# Patient Record
Sex: Male | Born: 1940 | Race: White | Hispanic: No | Marital: Married | Smoking: Former smoker
Health system: Southern US, Community
[De-identification: ages and names within clinical notes are randomized; demographics above are authoritative.]

## PROBLEM LIST (undated history)

## (undated) DIAGNOSIS — E7800 Pure hypercholesterolemia, unspecified: Secondary | ICD-10-CM

## (undated) DIAGNOSIS — M069 Rheumatoid arthritis, unspecified: Secondary | ICD-10-CM

## (undated) DIAGNOSIS — E039 Hypothyroidism, unspecified: Secondary | ICD-10-CM

## (undated) HISTORY — PX: COLONOSCOPY: SHX174

## (undated) HISTORY — DX: Rheumatoid arthritis, unspecified: M06.9

## (undated) HISTORY — DX: Hypothyroidism, unspecified: E03.9

## (undated) HISTORY — DX: Pure hypercholesterolemia, unspecified: E78.00

## (undated) HISTORY — PX: SHOULDER SURGERY: SHX246

---

## 2002-07-25 ENCOUNTER — Emergency Department (HOSPITAL_COMMUNITY): Admission: EM | Admit: 2002-07-25 | Discharge: 2002-07-25 | Payer: Self-pay | Admitting: Emergency Medicine

## 2002-08-01 ENCOUNTER — Emergency Department (HOSPITAL_COMMUNITY): Admission: EM | Admit: 2002-08-01 | Discharge: 2002-08-01 | Payer: Self-pay | Admitting: Emergency Medicine

## 2006-08-03 ENCOUNTER — Encounter: Admission: RE | Admit: 2006-08-03 | Discharge: 2006-08-03 | Payer: Self-pay | Admitting: Rheumatology

## 2007-11-29 IMAGING — CR DG CHEST 2V
2 series · 2 of 2 positions shown · non-contrast
Comparison: none

CLINICAL DATA: Screening for tuberculosis.  Rheumatoid arthritis. 
 DIAGNOSTIC CHEST ? 2 VIEW: 
 No comparison.

[w chest pa]
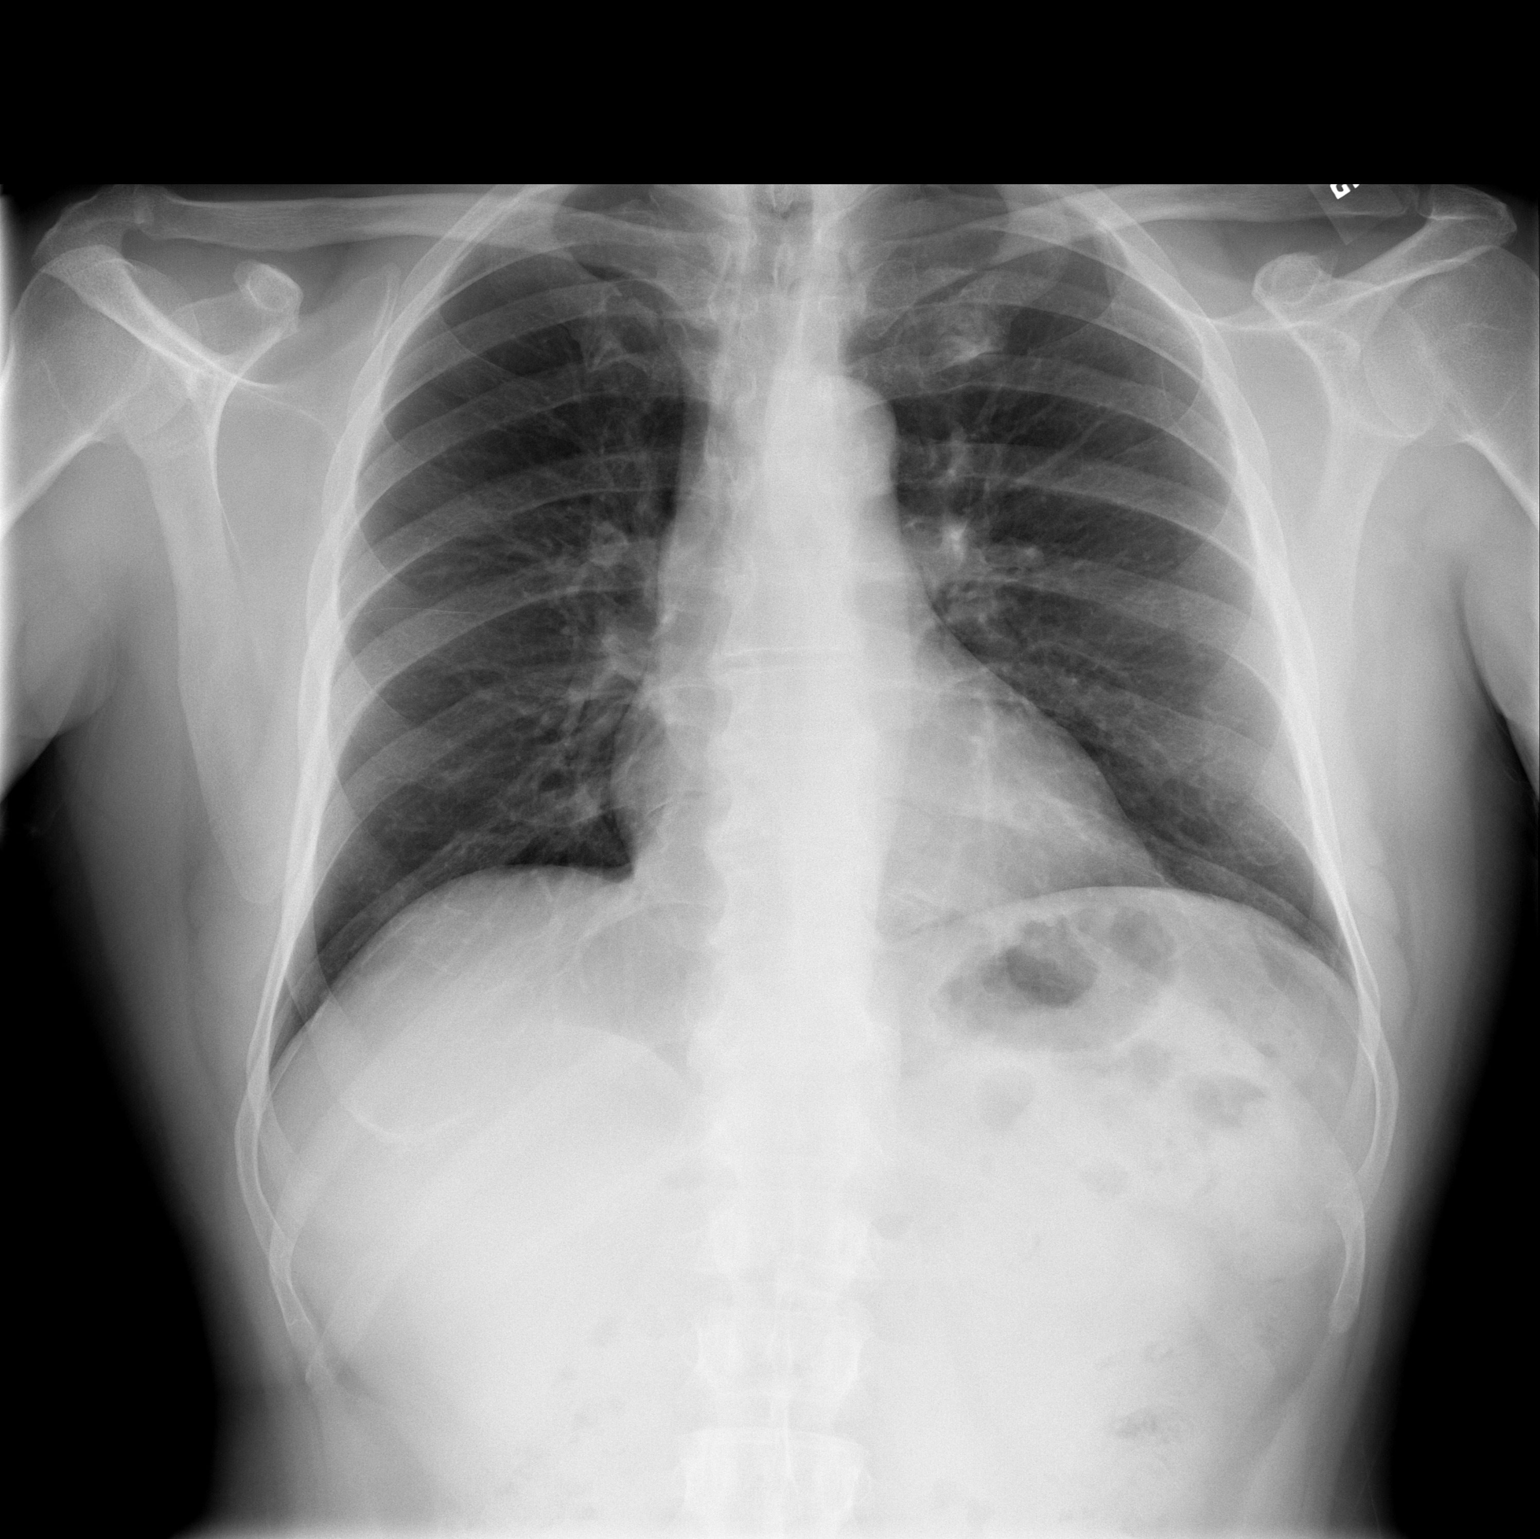

[w chest lat]
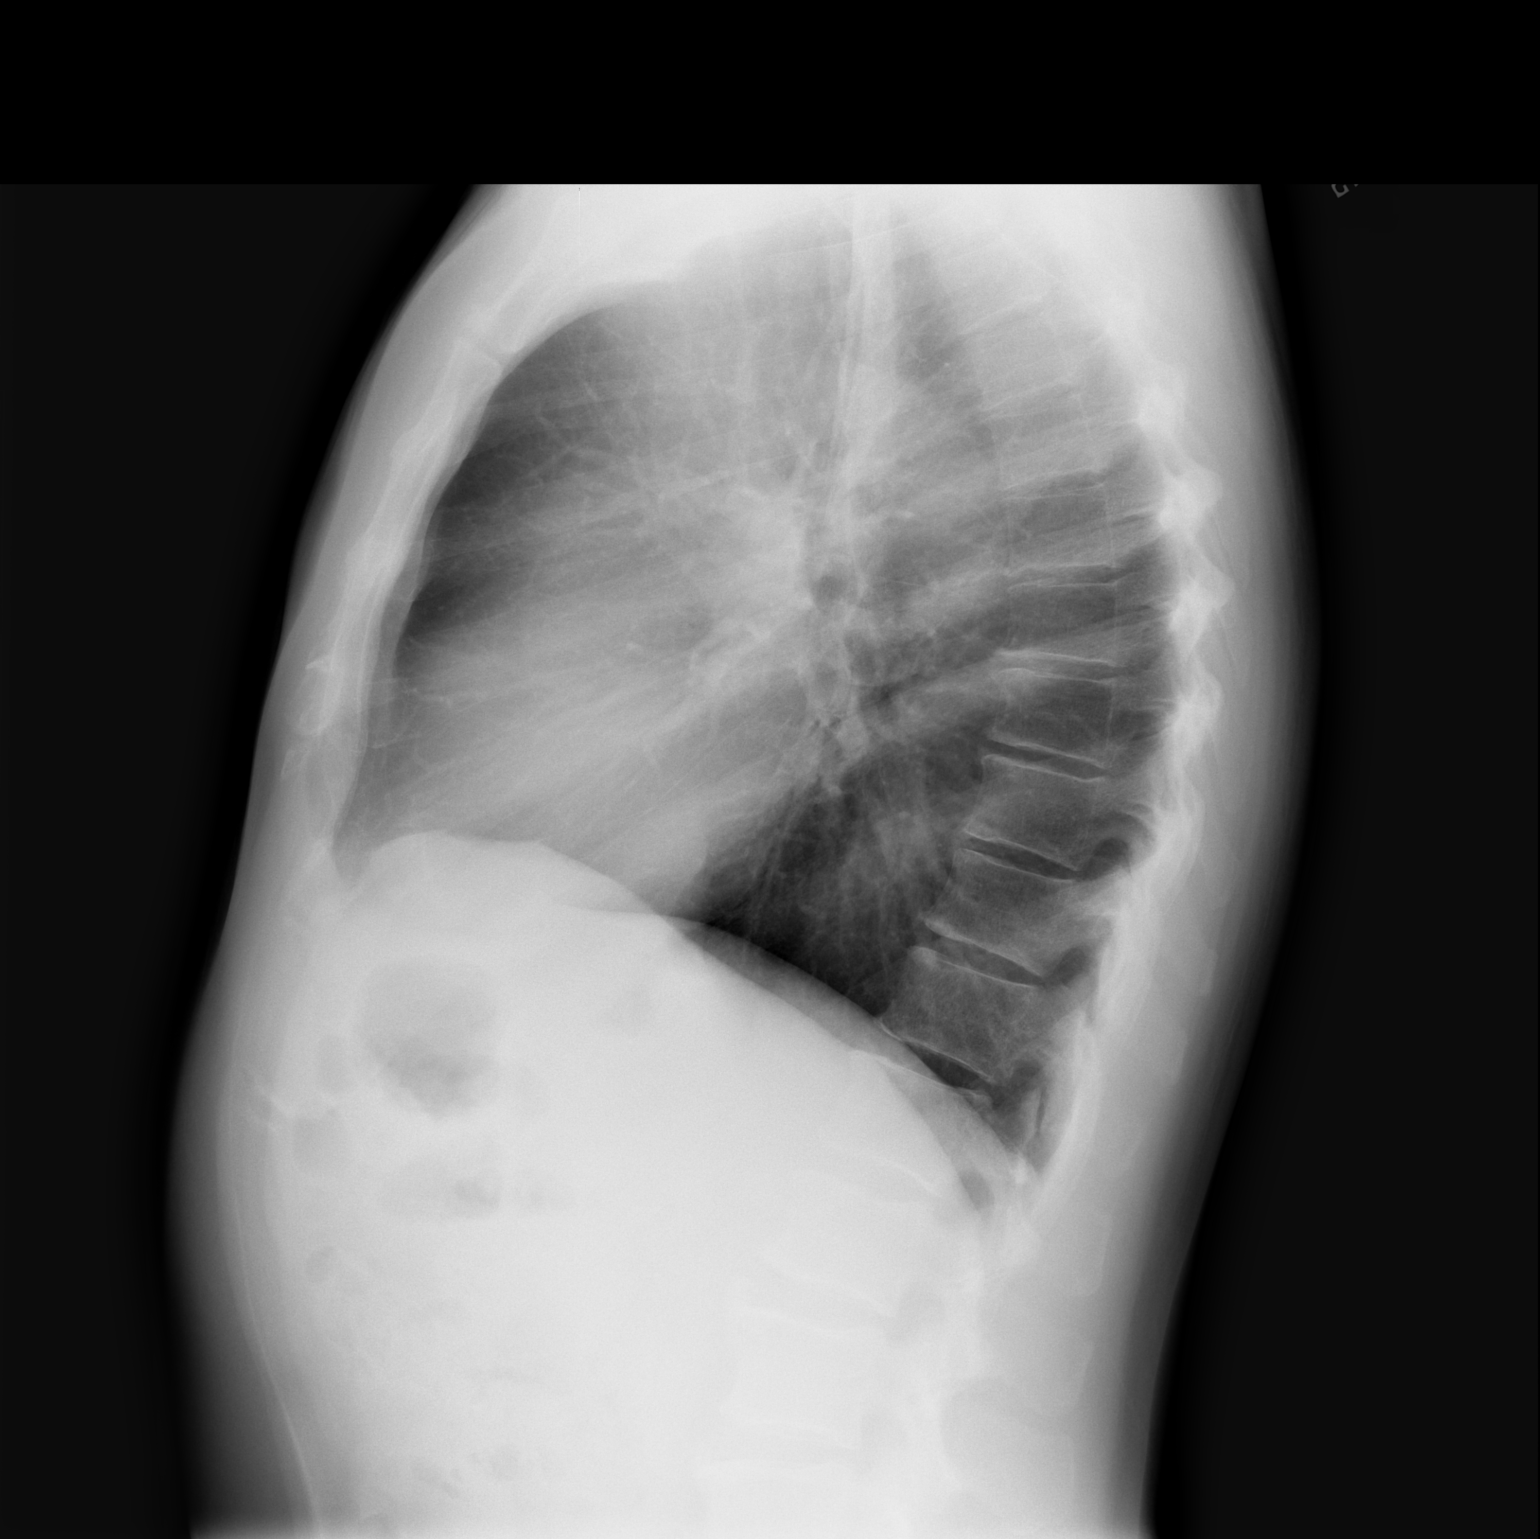

[2 of 2 positions shown; findings below may reference images not displayed]

FINDINGS: The heart size and mediastinal contours are within normal limits.  Both lungs are clear.  The visualized skeletal structures are unremarkable.
IMPRESSION: No active cardiopulmonary disease.

## 2016-08-30 ENCOUNTER — Other Ambulatory Visit: Payer: Self-pay | Admitting: Rheumatology

## 2016-08-30 MED ORDER — FOLIC ACID 1 MG PO TABS: 2.0000 mg | ORAL_TABLET | Freq: Every day | ORAL | 4 refills | Status: DC

## 2016-08-30 NOTE — Telephone Encounter (Signed)
Patient is requesting a refill of folic acid be sent to [facility] [address]

## 2016-08-30 NOTE — Telephone Encounter (Signed)
Last Visit: 06/18/16 Next Visit due February 2018 and message sent to front desk to schedule an appointment.  Okay to refill Folic acid?

## 2016-09-01 ENCOUNTER — Other Ambulatory Visit: Payer: Self-pay | Admitting: *Deleted

## 2016-11-01 ENCOUNTER — Encounter: Payer: Self-pay | Admitting: Rheumatology

## 2016-11-01 ENCOUNTER — Ambulatory Visit (INDEPENDENT_AMBULATORY_CARE_PROVIDER_SITE_OTHER): Payer: Medicare Other | Admitting: Rheumatology

## 2016-11-01 VITALS — BP 130/78 | HR 78 | Resp 16 | Ht 64.5 in | Wt 178.0 lb

## 2016-11-01 DIAGNOSIS — M0609 Rheumatoid arthritis without rheumatoid factor, multiple sites: Secondary | ICD-10-CM | POA: Diagnosis not present

## 2016-11-01 DIAGNOSIS — F40298 Other specified phobia: Secondary | ICD-10-CM | POA: Diagnosis not present

## 2016-11-01 DIAGNOSIS — Z79899 Other long term (current) drug therapy: Secondary | ICD-10-CM

## 2016-11-01 DIAGNOSIS — N289 Disorder of kidney and ureter, unspecified: Secondary | ICD-10-CM

## 2016-11-01 DIAGNOSIS — G8929 Other chronic pain: Secondary | ICD-10-CM

## 2016-11-01 DIAGNOSIS — Z87891 Personal history of nicotine dependence: Secondary | ICD-10-CM | POA: Diagnosis not present

## 2016-11-01 DIAGNOSIS — M25521 Pain in right elbow: Secondary | ICD-10-CM

## 2016-11-01 DIAGNOSIS — M25561 Pain in right knee: Secondary | ICD-10-CM

## 2016-11-01 MED ORDER — FOLIC ACID 1 MG PO TABS: 2.0000 mg | ORAL_TABLET | Freq: Every day | ORAL | 4 refills | Status: DC

## 2016-11-01 MED ORDER — METHOTREXATE 2.5 MG PO TABS: 15.0000 mg | ORAL_TABLET | ORAL | 0 refills | Status: DC

## 2016-11-01 NOTE — Progress Notes (Signed)
Office Visit Note  Patient: Larry Salazar             Date of Birth: 1940-10-18           MRN: [id number]             PCP: Heron Nay, PA Referring: No ref. provider found Visit Date: 11/01/2016 Occupation: @GUAROCC @    Subjective:  Follow-up (wants to change back to Methotrexate tablets) Follow-up on rheumatoid arthritis and high-risk prescription    History of Present Illness: Larry Salazar is a 76 y.o. male  Last seen 03/17/2016. Patient is reporting that he is doing well with the rheumatoid arthritis. No flare whatsoever. He is been off of methotrexate for now 2 weeks. He is on injectable but he does not like needles and although he tried it for stair amount of time, he wants to move back to the pills instead of the injectable methotrexate. He had a hard time affording the pills but now the price is more affordable since he's changed insurance. He states that the pills will be for free. He did respond well to the pills but the reason why he changed was because of affordability issue. He reports that it was over $100 when he was getting it on his previous insurance.  No joint pain swelling and stiffness according to the patient.  He recently had labs done. Please see "care everywhere" for details on the CBC and the CMP. Note that over 2016 until now, his GFR has decreased gradually. During the same. His creatinine has increased (but still is in the normal range.)   Activities of Daily Living:  Patient reports morning stiffness for 5 minutes.   Patient Denies nocturnal pain.  Difficulty dressing/grooming: Denies Difficulty climbing stairs: Denies Difficulty getting out of chair: Denies Difficulty using hands for taps, buttons, cutlery, and/or writing: Denies   Review of Systems  Constitutional: Negative for fatigue.  HENT: Negative for mouth sores and mouth dryness.   Eyes: Negative for dryness.  Respiratory: Negative for shortness of breath.   Gastrointestinal:  Negative for constipation and diarrhea.  Musculoskeletal: Negative for myalgias and myalgias.  Skin: Negative for sensitivity to sunlight.  Neurological: Negative for memory loss.  Psychiatric/Behavioral: Negative for sleep disturbance.    PMFS History:  There are no active problems to display for this patient.   No past medical history on file.  No family history on file. No past surgical history on file. Social History   Social History Narrative  . No narrative on file     Objective: Vital Signs: BP 130/78   Pulse 78   Resp 16   Ht 5' 4.5" (1.638 m)   Wt 178 lb (80.7 kg)   BMI 30.08 kg/m    Physical Exam  Constitutional: He is oriented to person, place, and time. He appears well-developed and well-nourished.  HENT:  Head: Normocephalic and atraumatic.  Eyes: Conjunctivae and EOM are normal. Pupils are equal, round, and reactive to light.  Neck: Normal range of motion. Neck supple.  Cardiovascular: Normal rate, regular rhythm and normal heart sounds.  Exam reveals no gallop and no friction rub.   No murmur heard. Pulmonary/Chest: Effort normal and breath sounds normal. No respiratory distress. He has no wheezes. He has no rales. He exhibits no tenderness.  Abdominal: Soft. He exhibits no distension and no mass. There is no tenderness. There is no guarding.  Musculoskeletal: Normal range of motion.  Lymphadenopathy:    He has no cervical  adenopathy.  Neurological: He is alert and oriented to person, place, and time. He exhibits normal muscle tone. Coordination normal.  Skin: Skin is warm and dry. Capillary refill takes less than 2 seconds. No rash noted.  Psychiatric: He has a normal mood and affect. His behavior is normal. Judgment and thought content normal.  Nursing note and vitals reviewed.    Musculoskeletal Exam:  Full range of motion of all joints Grip strength is equal and strong bilaterally Fibromyalgia tender points are all absent  CDAI Exam: CDAI  Homunculus Exam:   Joint Counts:  CDAI Tender Joint count: 0 CDAI Swollen Joint count: 0  Global Assessments:  Patient Global Assessment: 0 Provider Global Assessment: 0  No synovitis on examination  Investigation: No additional findings. No visits with results within 6 Month(s) from this visit.  Latest known visit with results is:  No results found for any previous visit.   Please see care everywhere for full details on labs. Labs done on 10/22/2016 shows CMP normal except for creatinine mildly elevated at 1.26 and GFR slightly low at 56 and ALT slightly increased at 47. We will monitor. CBC with differential is normal  Imaging: No results found.  Speciality Comments: No specialty comments available.    Procedures:  No procedures performed Allergies: Patient has no allergy information on record.   Assessment / Plan:     Visit Diagnoses: Rheumatoid arthritis, seronegative, multiple sites (HCC)  High risk medications (not anticoagulants) long-term use - 11/01/2016 switch from methotrexate injectable methotrexate oral: 6 pills per week (new insurance and "free cost")  Discontinued smoking - 11/01/2016: ==> d/c'd dec 2016; stopped x 1 year now;  Needle phobia - d/c'd injectable MTX now that oral mtx is affordable.  Pain in right elbow - 11/01/2016: Resolved  Chronic pain of right knee - 11/01/2016: Resolved    Plan: #1: Patient is doing very well with injectable methotrexate but due to the fear of needles and due to his new cost of methotrexate pills being "free" with his new insurance patient wants to go back on pills. We will call in methotrexate 6 pills per week 72 pills with [facility]. Note that he's been off of methotrexate injectable now for about 2 weeks. Also refilled his folic acid 90 day supply with 4 refills patient's joints are doing very well at this time. No synovitis, no joint pain or swelling or stiffness. Usually has a history of  right elbow joint pain of right knee joint pain but no longer.  #3: Patient has stopped smoking. It's been about a year and a month now that he has stopped smoking. This is the fourth time he has stopped smoking. He states that he does not plan to restart at this time  #4: CBC with differential and CMP with GFR will be due in about 3 months.  #5: Mild elevation of creatinine and mild decrease in GFR. We discussed the importance of having adequate hydration, avoiding medicines that can aggravate the kidney function.  #6: Patient has BMI of 30. We encouraged the patient to lose weight. She will observe proper exercise and nutrition.  #7: Return to clinic in 5 months  Orders: No orders of the defined types were placed in this encounter.  Meds ordered this encounter  Medications  . methotrexate (RHEUMATREX) 2.5 MG tablet    Sig: Take 6 tablets (15 mg total) by mouth once a week. Caution:Chemotherapy. Protect from light.    Dispense:  72 tablet  Refill:  0  . folic acid (FOLVITE) 1 MG tablet    Sig: Take 2 tablets (2 mg total) by mouth daily.    Dispense:  180 tablet    Refill:  4    Order Specific Question:   Supervising Provider    Answer:   Pollyann SavoyEVESHWAR, SHAILI [phone][[id number]]    Face-to-face time spent with patient was 30 minutes. 50% of time was spent in counseling and coordination of care.  Follow-Up Instructions: Return in about 5 months (around 03/31/2017).   Tawni PummelNaitik Jerrika Ledlow, PA-C  Note - This record has been created using [employer] software.  Chart creation errors have been sought, but may not always  have been located. Such creation errors do not reflect on  the standard of medical care.

## 2016-11-03 ENCOUNTER — Other Ambulatory Visit: Payer: Self-pay | Admitting: *Deleted

## 2017-01-21 ENCOUNTER — Telehealth: Payer: Self-pay | Admitting: Rheumatology

## 2017-01-21 DIAGNOSIS — Z79899 Other long term (current) drug therapy: Secondary | ICD-10-CM

## 2017-01-21 DIAGNOSIS — Z79631 Long term (current) use of antimetabolite agent: Secondary | ICD-10-CM

## 2017-01-21 NOTE — Telephone Encounter (Signed)
I do not have labs on him since September, I have Called pt for his lab plan he states he had labs, but they are not in system. I have double checked with [facility], and they were last done Sept. He will come by on Monday. I have put in the orders.

## 2017-01-21 NOTE — Telephone Encounter (Signed)
Patient called requesting a new Rx for MTX  90 day supply, sent to [facility]. Phone #[phone] Patient has a week supply left.

## 2017-01-24 ENCOUNTER — Other Ambulatory Visit: Payer: Self-pay | Admitting: Rheumatology

## 2017-01-24 ENCOUNTER — Other Ambulatory Visit: Payer: Self-pay | Admitting: Radiology

## 2017-01-24 DIAGNOSIS — Z79631 Long term (current) use of antimetabolite agent: Secondary | ICD-10-CM

## 2017-01-24 DIAGNOSIS — Z79899 Other long term (current) drug therapy: Secondary | ICD-10-CM

## 2017-01-24 LAB — CBC WITH DIFFERENTIAL/PLATELET
Basophils Absolute: 54 cells/uL (ref 0–200)
Basophils Relative: 1 %
Eosinophils Absolute: 216 cells/uL (ref 15–500)
Eosinophils Relative: 4 %
HCT: 45.2 % (ref 38.5–50.0)
Hemoglobin: 14.6 g/dL (ref 13.2–17.1)
Lymphocytes Relative: 22 %
Lymphs Abs: 1188 cells/uL (ref 850–3900)
MCH: 31.9 pg (ref 27.0–33.0)
MCHC: 32.3 g/dL (ref 32.0–36.0)
MCV: 98.9 fL (ref 80.0–100.0)
MPV: 10.2 fL (ref 7.5–12.5)
Monocytes Absolute: 918 cells/uL (ref 200–950)
Monocytes Relative: 17 %
Neutro Abs: 3024 cells/uL (ref 1500–7800)
Neutrophils Relative %: 56 %
Platelets: 181 10*3/uL (ref 140–400)
RBC: 4.57 MIL/uL (ref 4.20–5.80)
RDW: 13.9 % (ref 11.0–15.0)
WBC: 5.4 10*3/uL (ref 3.8–10.8)

## 2017-01-24 LAB — COMPREHENSIVE METABOLIC PANEL
AG RATIO: 1.3 ratio (ref 1.0–2.5)
ALK PHOS: 68 U/L (ref 40–115)
ALT: 22 U/L (ref 9–46)
AST: 37 U/L — AB (ref 10–35)
Albumin: 3.9 g/dL (ref 3.6–5.1)
BUN / CREAT RATIO: 10.5 ratio (ref 6–22)
BUN: 15 mg/dL (ref 7–25)
CHLORIDE: 107 mmol/L (ref 98–110)
CO2: 21 mmol/L (ref 20–31)
CREATININE: 1.43 mg/dL — AB (ref 0.70–1.18)
Calcium: 9.4 mg/dL (ref 8.6–10.3)
GFR, EST AFRICAN AMERICAN: 55 mL/min — AB (ref >=60)
GFR, Est Non African American: 48 mL/min — ABNORMAL LOW (ref >=60)
Globulin: 3 g/dL (ref 1.9–3.7)
Glucose, Bld: 125 mg/dL — ABNORMAL HIGH (ref 65–99)
POTASSIUM: 5 mmol/L (ref 3.5–5.3)
SODIUM: 140 mmol/L (ref 135–146)
Total Bilirubin: 1.3 mg/dL — ABNORMAL HIGH (ref 0.2–1.2)
Total Protein: 6.9 g/dL (ref 6.1–8.1)

## 2017-01-24 MED ORDER — METHOTREXATE 2.5 MG PO TABS: 15.0000 mg | ORAL_TABLET | ORAL | 0 refills | Status: DC

## 2017-01-24 NOTE — Telephone Encounter (Signed)
Patient came in for labs while he was getting labs he had Panwala send in the MTX

## 2017-01-24 NOTE — Telephone Encounter (Signed)
Patient is in the office for labs this morning and is requesting refill of MTX to be sent to [facility] (90 day supply) please.

## 2017-01-24 NOTE — Telephone Encounter (Signed)
Mr Larry Salazar has told me he has done this today.

## 2017-01-25 ENCOUNTER — Telehealth: Payer: Self-pay | Admitting: Radiology

## 2017-01-25 NOTE — Telephone Encounter (Signed)
-----   Message from Pollyann Savoy, MD sent at 01/25/2017  1:35 PM EDT ----- Please reduce MTX to 5 tabs po qweek

## 2017-01-25 NOTE — Telephone Encounter (Signed)
Called / line busy  Updated Rx module to reflect new dose.

## 2017-01-25 NOTE — Progress Notes (Signed)
Please reduce MTX to 5 tabs po qweek

## 2017-01-26 NOTE — Telephone Encounter (Signed)
Patient has been advised to decrease MTX to 5 tablet per week.

## 2017-03-31 ENCOUNTER — Ambulatory Visit: Payer: Medicare Other | Admitting: Rheumatology

## 2017-04-28 ENCOUNTER — Telehealth: Payer: Self-pay | Admitting: Rheumatology

## 2017-04-28 NOTE — Telephone Encounter (Signed)
11/01/16 last visit Next visit 05/12/17  CBC Latest Ref Rng & Units 01/24/2017  WBC 3.8 - 10.8 K/uL 5.4  Hemoglobin 13.2 - 17.1 g/dL 16.114.6  Hematocrit 09.638.5 - 50.0 % 45.2  Platelets 140 - 400 K/uL 181    CMP Latest Ref Rng & Units 01/24/2017  Glucose 65 - 99 mg/dL 045(W125(H)  BUN 7 - 25 mg/dL 15  Creatinine 0.980.70 - 1.191.18 mg/dL 1.47(W1.43(H)  Sodium 295135 - 621146 mmol/L 140  Potassium 3.5 - 5.3 mmol/L 5.0  Chloride 98 - 110 mmol/L 107  CO2 20 - 31 mmol/L 21  Calcium 8.6 - 10.3 mg/dL 9.4  Total Protein 6.1 - 8.1 g/dL 6.9  Total Bilirubin 0.2 - 1.2 mg/dL 3.0(Q1.3(H)  Alkaline Phos 40 - 115 U/L 68  AST 10 - 35 U/L 37(H)  ALT 9 - 46 U/L 22   Labs due for MTX / he is taking 5 per week, was decreased after last set of labs.   Patient states he will come in tomorrow

## 2017-04-28 NOTE — Telephone Encounter (Signed)
Patient left a message on machine that he is out of MTX, and needs a rx to be sent into [facility]. [phone]

## 2017-04-29 ENCOUNTER — Other Ambulatory Visit: Payer: Self-pay | Admitting: Pharmacist

## 2017-04-29 ENCOUNTER — Other Ambulatory Visit: Payer: Self-pay

## 2017-04-29 DIAGNOSIS — Z79631 Long term (current) use of antimetabolite agent: Secondary | ICD-10-CM

## 2017-04-29 DIAGNOSIS — Z79899 Other long term (current) drug therapy: Secondary | ICD-10-CM

## 2017-04-29 LAB — CBC WITH DIFFERENTIAL/PLATELET
BASOS PCT: 1 %
Basophils Absolute: 64 cells/uL (ref 0–200)
EOS ABS: 192 {cells}/uL (ref 15–500)
Eosinophils Relative: 3 %
HEMATOCRIT: 45.5 % (ref 38.5–50.0)
HEMOGLOBIN: 15.1 g/dL (ref 13.2–17.1)
LYMPHS PCT: 23 %
Lymphs Abs: 1472 cells/uL (ref 850–3900)
MCH: 33.4 pg — ABNORMAL HIGH (ref 27.0–33.0)
MCHC: 33.2 g/dL (ref 32.0–36.0)
MCV: 100.7 fL — ABNORMAL HIGH (ref 80.0–100.0)
MPV: 10.2 fL (ref 7.5–12.5)
Monocytes Absolute: 960 cells/uL — ABNORMAL HIGH (ref 200–950)
Monocytes Relative: 15 %
Neutro Abs: 3712 cells/uL (ref 1500–7800)
Neutrophils Relative %: 58 %
Platelets: 211 10*3/uL (ref 140–400)
RBC: 4.52 MIL/uL (ref 4.20–5.80)
RDW: 13.7 % (ref 11.0–15.0)
WBC: 6.4 10*3/uL (ref 3.8–10.8)

## 2017-04-29 NOTE — Telephone Encounter (Signed)
Patient had standing labs drawn today.  He is requesting refill of methotrexate.  Reviewed his chart.  Dose was reduced to 5 tablets weekly on 01/25/17 due to increase in creatinine to 1.43, GFR 48.  Patient reports he has still been taking 6 tablets weekly.    Last visit: 11/01/16 Next visit: 05/12/17 Labs: drawn today  Advised patient we will wait for labs to come back then assess methotrexate refill based on his lab work.    Lilla Shookachel Sulayman Manning, Pharm.D., BCPS, CPP Clinical Pharmacist Pager: [phone] Phone: [phone] 04/29/2017 9:31 AM

## 2017-04-30 LAB — COMPLETE METABOLIC PANEL WITH GFR
ALT: 22 U/L (ref 9–46)
AST: 32 U/L (ref 10–35)
Albumin: 4.1 g/dL (ref 3.6–5.1)
Alkaline Phosphatase: 72 U/L (ref 40–115)
BUN: 12 mg/dL (ref 7–25)
CALCIUM: 9.5 mg/dL (ref 8.6–10.3)
CHLORIDE: 104 mmol/L (ref 98–110)
CO2: 24 mmol/L (ref 20–31)
CREATININE: 1.19 mg/dL — AB (ref 0.70–1.18)
GFR, Est African American: 69 mL/min (ref >=60)
GFR, Est Non African American: 59 mL/min — ABNORMAL LOW (ref >=60)
Glucose, Bld: 107 mg/dL — ABNORMAL HIGH (ref 65–99)
Potassium: 4.4 mmol/L (ref 3.5–5.3)
Sodium: 141 mmol/L (ref 135–146)
TOTAL PROTEIN: 7 g/dL (ref 6.1–8.1)
Total Bilirubin: 1.1 mg/dL (ref 0.2–1.2)

## 2017-05-01 NOTE — Progress Notes (Signed)
Stable

## 2017-05-02 MED ORDER — METHOTREXATE 2.5 MG PO TABS: 15.0000 mg | ORAL_TABLET | ORAL | 0 refills | Status: DC

## 2017-05-02 NOTE — Telephone Encounter (Signed)
Patient advised he can take 5 tablets of MTX. Patient states he is not currently having any swelling or discomfort. Patient states he would think about going down on his MTX. Prescription refill sent to the pharmacy.

## 2017-05-05 DIAGNOSIS — M19041 Primary osteoarthritis, right hand: Secondary | ICD-10-CM | POA: Insufficient documentation

## 2017-05-05 DIAGNOSIS — M19071 Primary osteoarthritis, right ankle and foot: Secondary | ICD-10-CM | POA: Insufficient documentation

## 2017-05-05 DIAGNOSIS — M0609 Rheumatoid arthritis without rheumatoid factor, multiple sites: Secondary | ICD-10-CM | POA: Insufficient documentation

## 2017-05-05 DIAGNOSIS — Z79899 Other long term (current) drug therapy: Secondary | ICD-10-CM | POA: Insufficient documentation

## 2017-05-05 DIAGNOSIS — Z87891 Personal history of nicotine dependence: Secondary | ICD-10-CM | POA: Insufficient documentation

## 2017-05-05 DIAGNOSIS — Z87448 Personal history of other diseases of urinary system: Secondary | ICD-10-CM | POA: Insufficient documentation

## 2017-05-05 DIAGNOSIS — Z8639 Personal history of other endocrine, nutritional and metabolic disease: Secondary | ICD-10-CM | POA: Insufficient documentation

## 2017-05-05 DIAGNOSIS — Z9889 Other specified postprocedural states: Secondary | ICD-10-CM | POA: Insufficient documentation

## 2017-05-05 DIAGNOSIS — M19042 Primary osteoarthritis, left hand: Secondary | ICD-10-CM

## 2017-05-05 DIAGNOSIS — F40298 Other specified phobia: Secondary | ICD-10-CM | POA: Insufficient documentation

## 2017-05-05 DIAGNOSIS — M19072 Primary osteoarthritis, left ankle and foot: Secondary | ICD-10-CM | POA: Insufficient documentation

## 2017-05-05 NOTE — Progress Notes (Signed)
Office Visit Note  Patient: Larry Salazar             Date of Birth: [birth year]           MRN: [id number]             PCP: Heron NayYoung, Lauren E, PA Referring: Heron NayYoung, Lauren E, PA Visit Date: 05/12/2017 Occupation: @GUAROCC @    Subjective:  Medication Management (has ran out of Methotrexate/ wife has Breast Cancer )   History of Present Illness: Larry Salazar is a 76 y.o. male with history of seronegative rheumatoid arthritis. He states he ran out of methotrexate about 6-8 weeks ago. [facility] prescription [facility] and it was too expensive. [facility] now and he is waiting for the prescription to come back. He states he is not having any swelling while he has been off methotrexate. He has some joint stiffness. He also has some lower back pain which she relates to doing some gardening. His wife has been diagnosed with breast cancer and after that she's been in an accident which is been very stressful for him.  Activities of Daily Living:  Patient reports morning stiffness for 0 minute.   Patient Denies nocturnal pain.  Difficulty dressing/grooming: Denies Difficulty climbing stairs: Denies Difficulty getting out of chair: Denies Difficulty using hands for taps, buttons, cutlery, and/or writing: Denies   Review of Systems  Constitutional: Negative for fatigue, night sweats and weakness ( ).  HENT: Negative for mouth sores, mouth dryness and nose dryness.   Eyes: Negative for redness and dryness.  Respiratory: Negative for shortness of breath and difficulty breathing.   Cardiovascular: Negative for chest pain, palpitations, hypertension, irregular heartbeat and swelling in legs/feet.  Gastrointestinal: Negative for constipation and diarrhea.  Endocrine: Negative for increased urination.  Musculoskeletal: Negative for arthralgias, joint pain, joint swelling, myalgias, muscle weakness, morning stiffness, muscle tenderness and myalgias.  Skin: Negative for color  change, rash, hair loss, nodules/bumps, skin tightness, ulcers and sensitivity to sunlight.  Allergic/Immunologic: Negative for susceptible to infections.  Neurological: Negative for dizziness, fainting, memory loss and night sweats.  Hematological: Negative for swollen glands.  Psychiatric/Behavioral: Negative for depressed mood and sleep disturbance. The patient is not nervous/anxious.     PMFS History:  Patient Active Problem List   Diagnosis Date Noted  . Rheumatoid arthritis, seronegative, multiple sites (HCC) 05/05/2017  . High risk medications  long-term use 05/05/2017  . Needle phobia 05/05/2017  . History of renal insufficiency 05/05/2017  . Primary osteoarthritis of both hands 05/05/2017  . Primary osteoarthritis of both feet 05/05/2017  . History of biceps tendon repair  05/05/2017  . History of hypothyroidism 05/05/2017  . History of hyperlipidemia 05/05/2017  . Former smoker Quit 2017  05/05/2017    History reviewed. No pertinent past medical history.  No family history on file. History reviewed. No pertinent surgical history. Social History   Social History Narrative  . No narrative on file     Objective: Vital Signs: BP 140/78   Pulse 76   Resp 14   Ht 5\' 5"  (1.651 m)   Wt 172 lb (78 kg)   BMI 28.62 kg/m    Physical Exam  Constitutional: He is oriented to person, place, and time. He appears well-developed and well-nourished.  HENT:  Head: Normocephalic and atraumatic.  Eyes: Pupils are equal, round, and reactive to light. Conjunctivae and EOM are normal.  Neck: Normal range of motion. Neck supple.  Cardiovascular: Normal  rate, regular rhythm and normal heart sounds.   Pulmonary/Chest: Effort normal and breath sounds normal.  Abdominal: Soft. Bowel sounds are normal.  Neurological: He is alert and oriented to person, place, and time.  Skin: Skin is warm and dry. Capillary refill takes less than 2 seconds.  Psychiatric: He has a normal mood and affect.  His behavior is normal.  Nursing note and vitals reviewed.    Musculoskeletal Exam: He has some discomfort with range of motion of his lumbar spine. Shoulder joints elbow joints wrist joint MCPs PIPs DIPs with good range of motion. He had no synovitis. He has thickening of PIP/DIP joints consistent with osteoarthritis. Hip joints knee joints ankles MTPs PIPs DIPs are good range of motion with no synovitis.  CDAI Exam: CDAI Homunculus Exam:   Joint Counts:  CDAI Tender Joint count: 0 CDAI Swollen Joint count: 0  Global Assessments:  Patient Global Assessment: 1 Provider Global Assessment: 1  CDAI Calculated Score: 2    Investigation: No additional findings. CBC Latest Ref Rng & Units 04/29/2017 01/24/2017  WBC 3.8 - 10.8 K/uL 6.4 5.4  Hemoglobin 13.2 - 17.1 g/dL 45.4 09.8  Hematocrit 11.9 - 50.0 % 45.5 45.2  Platelets 140 - 400 K/uL 211 181   CMP Latest Ref Rng & Units 04/29/2017 01/24/2017  Glucose 65 - 99 mg/dL 147(W) 295(A)  BUN 7 - 25 mg/dL 12 15  Creatinine 2.13 - 1.18 mg/dL 0.86(V) 7.84(O)  Sodium 135 - 146 mmol/L 141 140  Potassium 3.5 - 5.3 mmol/L 4.4 5.0  Chloride 98 - 110 mmol/L 104 107  CO2 20 - 31 mmol/L 24 21  Calcium 8.6 - 10.3 mg/dL 9.5 9.4  Total Protein 6.1 - 8.1 g/dL 7.0 6.9  Total Bilirubin 0.2 - 1.2 mg/dL 1.1 9.6(E)  Alkaline Phos 40 - 115 U/L 72 68  AST 10 - 35 U/L 32 37(H)  ALT 9 - 46 U/L 22 22    Imaging: No results found.  Speciality Comments: No specialty comments available.    Procedures:  No procedures performed Allergies: Patient has no allergy information on record.   Assessment / Plan:     Visit Diagnoses: Rheumatoid arthritis, seronegative, multiple sites [facility]): He had no synovitis on examination today. He has had flare off methotrexate in the past. His been experiencing some morning stiffness. He will be [facility] and will resume his medication. We discussed about decreasing methotrexate to 5  tablets per week next month if he continues to do well.  High risk medications long-term use - Methotrexate 6 tablets by mouth every week, folic acid 1 mg by mouth daily. His creatinine is mildly elevated but stable.  Needle phobia  History of renal insufficiency: GFR is stable  Primary osteoarthritis of both hands: Minimal stiffness  Primary osteoarthritis of both feet: Minimal stiffness  History of biceps tendon repair right  History of hypothyroidism  History of hyperlipidemia  Former smoker Quit 2017     Orders: No orders of the defined types were placed in this encounter.  No orders of the defined types were placed in this encounter.     Follow-Up Instructions: Return in about 5 months (around 10/12/2017) for Rheumatoid arthritis.   Pollyann Savoy, MD  Note - This record has been created using Animal nutritionist.  Chart creation errors have been sought, but may not always  have been located. Such creation errors do not reflect on  the standard of medical care.

## 2017-05-09 ENCOUNTER — Telehealth: Payer: Self-pay | Admitting: Rheumatology

## 2017-05-09 MED ORDER — METHOTREXATE 2.5 MG PO TABS: 15.0000 mg | ORAL_TABLET | ORAL | 0 refills | Status: DC

## 2017-05-09 NOTE — Telephone Encounter (Signed)
Patient states [facility] for [facility]TX tabs [facility], but it needed to be [facility] to [facility]. [facility] #[phone]

## 2017-05-09 NOTE — Telephone Encounter (Signed)
Prescription resent to [facility].

## 2017-05-12 ENCOUNTER — Telehealth: Payer: Self-pay | Admitting: Rheumatology

## 2017-05-12 ENCOUNTER — Encounter: Payer: Self-pay | Admitting: Rheumatology

## 2017-05-12 ENCOUNTER — Ambulatory Visit (INDEPENDENT_AMBULATORY_CARE_PROVIDER_SITE_OTHER): Payer: Medicare Other | Admitting: Rheumatology

## 2017-05-12 VITALS — BP 140/78 | HR 76 | Resp 14 | Ht 65.0 in | Wt 172.0 lb

## 2017-05-12 DIAGNOSIS — M19041 Primary osteoarthritis, right hand: Secondary | ICD-10-CM

## 2017-05-12 DIAGNOSIS — M19042 Primary osteoarthritis, left hand: Secondary | ICD-10-CM

## 2017-05-12 DIAGNOSIS — F40298 Other specified phobia: Secondary | ICD-10-CM | POA: Diagnosis not present

## 2017-05-12 DIAGNOSIS — Z8639 Personal history of other endocrine, nutritional and metabolic disease: Secondary | ICD-10-CM | POA: Diagnosis not present

## 2017-05-12 DIAGNOSIS — Z87448 Personal history of other diseases of urinary system: Secondary | ICD-10-CM | POA: Diagnosis not present

## 2017-05-12 DIAGNOSIS — M19072 Primary osteoarthritis, left ankle and foot: Secondary | ICD-10-CM

## 2017-05-12 DIAGNOSIS — Z9889 Other specified postprocedural states: Secondary | ICD-10-CM | POA: Diagnosis not present

## 2017-05-12 DIAGNOSIS — M19071 Primary osteoarthritis, right ankle and foot: Secondary | ICD-10-CM | POA: Diagnosis not present

## 2017-05-12 DIAGNOSIS — Z79899 Other long term (current) drug therapy: Secondary | ICD-10-CM | POA: Diagnosis not present

## 2017-05-12 DIAGNOSIS — Z87891 Personal history of nicotine dependence: Secondary | ICD-10-CM

## 2017-05-12 DIAGNOSIS — M0609 Rheumatoid arthritis without rheumatoid factor, multiple sites: Secondary | ICD-10-CM

## 2017-05-12 NOTE — Patient Instructions (Signed)
Standing Labs We placed an order today for your standing lab work.    Please come back and get your standing labs in November and every 3 months  We have open lab Monday through Friday from 8:30-11:30 AM and 1:30-4 PM at the office of Dr. Azaela Caracci.   The office is located at [address], [address], [place], [place] [address] No appointment is necessary.   Labs are drawn by [facility].  You may receive a bill from [facility] for your lab work. If you have any questions regarding directions or hours of operation,  please call [phone].    

## 2017-05-12 NOTE — Telephone Encounter (Signed)
Please change patient's preferred pharmacy [facility] for all medications.

## 2017-05-19 ENCOUNTER — Other Ambulatory Visit: Payer: Self-pay | Admitting: Rheumatology

## 2017-05-19 NOTE — Telephone Encounter (Signed)
Last Visit: 05/12/17 Next Visit: 11/08/17 Labs: 04/29/17 Stable   Okay to refill per Dr. Corliss Skains

## 2017-08-10 ENCOUNTER — Telehealth: Payer: Self-pay | Admitting: Rheumatology

## 2017-08-10 NOTE — Telephone Encounter (Signed)
Patient needs refill on MTX. Patient uses [facility]. Please call to advise.

## 2017-08-11 MED ORDER — METHOTREXATE 2.5 MG PO TABS: ORAL_TABLET | ORAL | 0 refills | Status: DC

## 2017-08-11 NOTE — Telephone Encounter (Signed)
Last Visit: 05/12/17 Next Visit: 11/08/17 Labs: 04/29/17 Stable   Left message to advise patient he is due for labs  Okay to refill 30 day supply per Dr. Corliss Skainseveshwar

## 2017-08-15 ENCOUNTER — Other Ambulatory Visit: Payer: Self-pay | Admitting: Rheumatology

## 2017-08-16 ENCOUNTER — Other Ambulatory Visit: Payer: Self-pay

## 2017-08-16 DIAGNOSIS — Z79631 Long term (current) use of antimetabolite agent: Secondary | ICD-10-CM

## 2017-08-16 DIAGNOSIS — Z79899 Other long term (current) drug therapy: Secondary | ICD-10-CM

## 2017-08-16 LAB — CBC WITH DIFFERENTIAL/PLATELET
BASOS ABS: 78 {cells}/uL (ref 0–200)
BASOS PCT: 1.3 %
EOS ABS: 102 {cells}/uL (ref 15–500)
Eosinophils Relative: 1.7 %
HCT: 44.6 % (ref 38.5–50.0)
HEMOGLOBIN: 15.2 g/dL (ref 13.2–17.1)
Lymphs Abs: 1176 cells/uL (ref 850–3900)
MCH: 33.1 pg — AB (ref 27.0–33.0)
MCHC: 34.1 g/dL (ref 32.0–36.0)
MCV: 97.2 fL (ref 80.0–100.0)
MPV: 10.2 fL (ref 7.5–12.5)
Monocytes Relative: 14.1 %
NEUTROS ABS: 3798 {cells}/uL (ref 1500–7800)
Neutrophils Relative %: 63.3 %
Platelets: 197 10*3/uL (ref 140–400)
RBC: 4.59 10*6/uL (ref 4.20–5.80)
RDW: 12.6 % (ref 11.0–15.0)
TOTAL LYMPHOCYTE: 19.6 %
WBC: 6 10*3/uL (ref 3.8–10.8)
WBCMIX: 846 {cells}/uL (ref 200–950)

## 2017-08-16 LAB — COMPLETE METABOLIC PANEL WITH GFR
AG RATIO: 1.9 (calc) (ref 1.0–2.5)
ALBUMIN MSPROF: 4.7 g/dL (ref 3.6–5.1)
ALKALINE PHOSPHATASE (APISO): 75 U/L (ref 40–115)
ALT: 28 U/L (ref 9–46)
AST: 46 U/L — AB (ref 10–35)
BUN: 16 mg/dL (ref 7–25)
CO2: 27 mmol/L (ref 20–32)
CREATININE: 1.18 mg/dL (ref 0.70–1.18)
Calcium: 9.2 mg/dL (ref 8.6–10.3)
Chloride: 105 mmol/L (ref 98–110)
GFR, EST AFRICAN AMERICAN: 70 mL/min/{1.73_m2} (ref >=60)
GFR, EST NON AFRICAN AMERICAN: 60 mL/min/{1.73_m2} (ref >=60)
GLOBULIN: 2.5 g/dL (ref 1.9–3.7)
Glucose, Bld: 111 mg/dL — ABNORMAL HIGH (ref 65–99)
Potassium: 4.1 mmol/L (ref 3.5–5.3)
SODIUM: 140 mmol/L (ref 135–146)
Total Bilirubin: 1.9 mg/dL — ABNORMAL HIGH (ref 0.2–1.2)
Total Protein: 7.2 g/dL (ref 6.1–8.1)

## 2017-08-16 NOTE — Telephone Encounter (Addendum)
Prescription was sent in on 08/11/17

## 2017-08-17 ENCOUNTER — Telehealth: Payer: Self-pay | Admitting: Rheumatology

## 2017-08-17 MED ORDER — METHOTREXATE 2.5 MG PO TABS: ORAL_TABLET | ORAL | 0 refills | Status: DC

## 2017-08-17 NOTE — Telephone Encounter (Signed)
Patient calling to see if you will go ahead, and send in the rx for MTX to [facility]. Patient had labs yesterday.

## 2017-08-17 NOTE — Telephone Encounter (Signed)
Patient advised of lab results. Patient has been using Ibuprofen due to a headache. Patient advised to avoid NSAIDS, as well as tylenol and alcohol. Patient verbalized understanding. Patient advised [facility] for a 90 day supply.

## 2017-09-01 ENCOUNTER — Telehealth: Payer: Self-pay

## 2017-09-01 NOTE — Telephone Encounter (Signed)
Patient called stating that he needed a 90 day supply of MTX and would like for someone to call the pharmacy at [phone].  Cb# is [phone].  Please advise.  Thank you.

## 2017-09-01 NOTE — Telephone Encounter (Signed)
Patient advised a 90 day supply has been sent to the pharmacy on 08/17/17. Patient will contact [facility] to find out when prescription will be sent.

## 2017-09-07 ENCOUNTER — Telehealth: Payer: Self-pay

## 2017-09-07 NOTE — Telephone Encounter (Signed)
Patient called concerning [facility] being [facility].

## 2017-09-21 ENCOUNTER — Other Ambulatory Visit: Payer: Self-pay | Admitting: Rheumatology

## 2017-09-21 NOTE — Telephone Encounter (Signed)
Patient left a message requesting an RX refill on his MTX Tab 2.5mg  be called into [facility] at [phone].  He is needing his 90-day supply not the 75-day supply.  His [phone]

## 2017-09-21 NOTE — Telephone Encounter (Signed)
Attempted to contact the patient and left message for patient to call the office.  

## 2017-09-21 NOTE — Telephone Encounter (Signed)
Patient advised prescription was sent for a 90 day supply on 08/17/17. Patient will contact pharmacy and have them contact the office if they are having difficulty filling the prescription.

## 2017-09-23 ENCOUNTER — Telehealth: Payer: Self-pay | Admitting: Rheumatology

## 2017-09-23 MED ORDER — METHOTREXATE 2.5 MG PO TABS: ORAL_TABLET | ORAL | 0 refills | Status: DC

## 2017-09-23 NOTE — Telephone Encounter (Signed)
Patient called stating that his pharmacy [facility] needs our office to call them so they can refill his 90 day supply of Methotrexate.

## 2017-09-23 NOTE — Telephone Encounter (Signed)
Last Visit: 05/12/17 Next Visit: 11/08/17 Labs: 08/16/17 AST trending up. All other lab results are stable.  Okay to refill per Dr. Corliss Skainseveshwar

## 2017-09-26 ENCOUNTER — Telehealth: Payer: Self-pay | Admitting: Rheumatology

## 2017-09-26 NOTE — Telephone Encounter (Signed)
Patient left a voice mail requesting a refill for his Methotrexate.  He stated he left a previous message but wanted to make sure we knew he is completely out.  Thank you

## 2017-09-28 NOTE — Telephone Encounter (Signed)
Patient advised prescription was sent to the pharmacy on 09/23/17. Patient states [facility] will be sending out his prescription.

## 2017-11-08 ENCOUNTER — Ambulatory Visit: Payer: Medicare Other | Admitting: Rheumatology

## 2017-12-14 ENCOUNTER — Other Ambulatory Visit: Payer: Self-pay | Admitting: Rheumatology

## 2017-12-14 NOTE — Telephone Encounter (Signed)
Last Visit: 05/12/17 Next Visit was due January 2019. Left message for patient to advise he is due for a follow up appointment.   Okay to refill 30 day supply per Dr. Corliss Skainseveshwar

## 2018-01-19 ENCOUNTER — Telehealth: Payer: Self-pay | Admitting: Rheumatology

## 2018-01-19 MED ORDER — METHOTREXATE 2.5 MG PO TABS: ORAL_TABLET | ORAL | 0 refills | Status: DC

## 2018-01-19 NOTE — Telephone Encounter (Addendum)
Last Visit: 08/15/17 Next Visit: 01/24/18 Labs: 08/16/17 AST trending up. All other lab results are stable.   patient will update labs at his appointment.  Okay to refill per Dr. Corliss Skainseveshwar.

## 2018-01-19 NOTE — Telephone Encounter (Signed)
Patient needs a refill on MTX sent to [facility]. Patient is out, and over due. Patient wants a 90 supply.

## 2018-01-20 NOTE — Progress Notes (Deleted)
Office Visit Note  Patient: Larry Salazar             Date of Birth: Jun 19, 1941           MRN: [id number]             PCP: Heron NayYoung, Lauren E, PA Referring: Heron NayYoung, Lauren E, PA Visit Date: 01/24/2018 Occupation: @GUAROCC @    Subjective:  No chief complaint on file.   History of Present Illness: Larry Salazar is a 77 y.o. male ***   Activities of Daily Living:  Patient reports morning stiffness for *** {minute/hour:19697}.   Patient {ACTIONS;DENIES/REPORTS:21021675::"Denies"} nocturnal pain.  Difficulty dressing/grooming: {ACTIONS;DENIES/REPORTS:21021675::"Denies"} Difficulty climbing stairs: {ACTIONS;DENIES/REPORTS:21021675::"Denies"} Difficulty getting out of chair: {ACTIONS;DENIES/REPORTS:21021675::"Denies"} Difficulty using hands for taps, buttons, cutlery, and/or writing: {ACTIONS;DENIES/REPORTS:21021675::"Denies"}   No Rheumatology ROS completed.   PMFS History:  Patient Active Problem List   Diagnosis Date Noted  . Rheumatoid arthritis, seronegative, multiple sites (HCC) 05/05/2017  . High risk medications  long-term use 05/05/2017  . Needle phobia 05/05/2017  . History of renal insufficiency 05/05/2017  . Primary osteoarthritis of both hands 05/05/2017  . Primary osteoarthritis of both feet 05/05/2017  . History of biceps tendon repair  05/05/2017  . History of hypothyroidism 05/05/2017  . History of hyperlipidemia 05/05/2017  . Former smoker Quit 2017  05/05/2017    No past medical history on file.  No family history on file. No past surgical history on file. Social History   Social History Narrative  . Not on file     Objective: Vital Signs: There were no vitals taken for this visit.   Physical Exam   Musculoskeletal Exam: ***  CDAI Exam: No CDAI exam completed.    Investigation: No additional findings. CBC Latest Ref Rng & Units 08/16/2017 04/29/2017 01/24/2017  WBC 3.8 - 10.8 Thousand/uL 6.0 6.4 5.4  Hemoglobin 13.2 - 17.1 g/dL 78.215.2 95.615.1  21.314.6  Hematocrit 38.5 - 50.0 % 44.6 45.5 45.2  Platelets 140 - 400 Thousand/uL 197 211 181   CMP Latest Ref Rng & Units 08/16/2017 04/29/2017 01/24/2017  Glucose 65 - 99 mg/dL 086(V111(H) 784(O107(H) 962(X125(H)  BUN 7 - 25 mg/dL 16 12 15   Creatinine 0.70 - 1.18 mg/dL 5.281.18 4.13(K1.19(H) 4.40(N1.43(H)  Sodium 135 - 146 mmol/L 140 141 140  Potassium 3.5 - 5.3 mmol/L 4.1 4.4 5.0  Chloride 98 - 110 mmol/L 105 104 107  CO2 20 - 32 mmol/L 27 24 21   Calcium 8.6 - 10.3 mg/dL 9.2 9.5 9.4  Total Protein 6.1 - 8.1 g/dL 7.2 7.0 6.9  Total Bilirubin 0.2 - 1.2 mg/dL 0.2(V1.9(H) 1.1 2.5(D1.3(H)  Alkaline Phos 40 - 115 U/L - 72 68  AST 10 - 35 U/L 46(H) 32 37(H)  ALT 9 - 46 U/L 28 22 22      Imaging: No results found.  Speciality Comments: No specialty comments available.    Procedures:  No procedures performed Allergies: Patient has no allergy information on record.   Assessment / Plan:     Visit Diagnoses: Rheumatoid arthritis, seronegative, multiple sites (HCC)  High risk medications  long-term use - MTX and folic acid CBC and CMP:  Primary osteoarthritis of both hands  Primary osteoarthritis of both feet  History of biceps tendon repair   History of hyperlipidemia  History of hypothyroidism  History of renal insufficiency  Needle phobia  Former smoker Quit 2017     Orders: No orders of the defined types were placed in this encounter.  No orders of the defined types  were placed in this encounter.   Face-to-face time spent with patient was *** minutes. 50% of time was spent in counseling and coordination of care.  Follow-Up Instructions: No follow-ups on file.   Ofilia Neas, PA-C  Note - This record has been created using Dragon software.  Chart creation errors have been sought, but may not always  have been located. Such creation errors do not reflect on  the standard of medical care.

## 2018-01-23 ENCOUNTER — Ambulatory Visit (INDEPENDENT_AMBULATORY_CARE_PROVIDER_SITE_OTHER): Payer: Medicare Other | Admitting: Rheumatology

## 2018-01-23 ENCOUNTER — Other Ambulatory Visit: Payer: Self-pay | Admitting: *Deleted

## 2018-01-23 ENCOUNTER — Encounter: Payer: Self-pay | Admitting: Physician Assistant

## 2018-01-23 VITALS — BP 145/86 | HR 51 | Resp 12 | Ht 65.0 in | Wt 182.0 lb

## 2018-01-23 DIAGNOSIS — F40298 Other specified phobia: Secondary | ICD-10-CM

## 2018-01-23 DIAGNOSIS — M19041 Primary osteoarthritis, right hand: Secondary | ICD-10-CM | POA: Diagnosis not present

## 2018-01-23 DIAGNOSIS — Z8639 Personal history of other endocrine, nutritional and metabolic disease: Secondary | ICD-10-CM | POA: Diagnosis not present

## 2018-01-23 DIAGNOSIS — M19071 Primary osteoarthritis, right ankle and foot: Secondary | ICD-10-CM

## 2018-01-23 DIAGNOSIS — Z79899 Other long term (current) drug therapy: Secondary | ICD-10-CM | POA: Diagnosis not present

## 2018-01-23 DIAGNOSIS — M19072 Primary osteoarthritis, left ankle and foot: Secondary | ICD-10-CM

## 2018-01-23 DIAGNOSIS — Z87891 Personal history of nicotine dependence: Secondary | ICD-10-CM

## 2018-01-23 DIAGNOSIS — M19042 Primary osteoarthritis, left hand: Secondary | ICD-10-CM | POA: Diagnosis not present

## 2018-01-23 DIAGNOSIS — Z9889 Other specified postprocedural states: Secondary | ICD-10-CM | POA: Diagnosis not present

## 2018-01-23 DIAGNOSIS — Z87448 Personal history of other diseases of urinary system: Secondary | ICD-10-CM

## 2018-01-23 DIAGNOSIS — M0609 Rheumatoid arthritis without rheumatoid factor, multiple sites: Secondary | ICD-10-CM | POA: Diagnosis not present

## 2018-01-23 LAB — CBC WITH DIFFERENTIAL/PLATELET
Basophils Absolute: 80 cells/uL (ref 0–200)
Basophils Relative: 1.2 %
EOS PCT: 2.5 %
Eosinophils Absolute: 168 cells/uL (ref 15–500)
HEMATOCRIT: 42 % (ref 38.5–50.0)
Hemoglobin: 14.4 g/dL (ref 13.2–17.1)
LYMPHS ABS: 1508 {cells}/uL (ref 850–3900)
MCH: 33 pg (ref 27.0–33.0)
MCHC: 34.3 g/dL (ref 32.0–36.0)
MCV: 96.3 fL (ref 80.0–100.0)
MPV: 10.8 fL (ref 7.5–12.5)
Monocytes Relative: 16.3 %
NEUTROS PCT: 57.5 %
Neutro Abs: 3853 cells/uL (ref 1500–7800)
PLATELETS: 192 10*3/uL (ref 140–400)
RBC: 4.36 10*6/uL (ref 4.20–5.80)
RDW: 12.2 % (ref 11.0–15.0)
TOTAL LYMPHOCYTE: 22.5 %
WBC mixed population: 1092 cells/uL — ABNORMAL HIGH (ref 200–950)
WBC: 6.7 10*3/uL (ref 3.8–10.8)

## 2018-01-23 LAB — COMPLETE METABOLIC PANEL WITH GFR
AG RATIO: 1.6 (calc) (ref 1.0–2.5)
ALBUMIN MSPROF: 4.5 g/dL (ref 3.6–5.1)
ALKALINE PHOSPHATASE (APISO): 77 U/L (ref 40–115)
ALT: 15 U/L (ref 9–46)
AST: 29 U/L (ref 10–35)
BILIRUBIN TOTAL: 1.1 mg/dL (ref 0.2–1.2)
BUN / CREAT RATIO: 13 (calc) (ref 6–22)
BUN: 17 mg/dL (ref 7–25)
CHLORIDE: 106 mmol/L (ref 98–110)
CO2: 28 mmol/L (ref 20–32)
Calcium: 9.5 mg/dL (ref 8.6–10.3)
Creat: 1.28 mg/dL — ABNORMAL HIGH (ref 0.70–1.18)
GFR, EST AFRICAN AMERICAN: 63 mL/min/{1.73_m2} (ref >=60)
GFR, Est Non African American: 54 mL/min/{1.73_m2} — ABNORMAL LOW (ref >=60)
GLOBULIN: 2.8 g/dL (ref 1.9–3.7)
GLUCOSE: 114 mg/dL — AB (ref 65–99)
POTASSIUM: 4.8 mmol/L (ref 3.5–5.3)
SODIUM: 139 mmol/L (ref 135–146)
TOTAL PROTEIN: 7.3 g/dL (ref 6.1–8.1)

## 2018-01-23 NOTE — Patient Instructions (Signed)
Standing Labs We placed an order today for your standing lab work.    Please come back and get your standing labs in July and every 3 months   We have open lab Monday through Friday from 8:30-11:30 AM and 1:30-4:00 PM  at the office of Dr. Shaili Deveshwar.   You may experience shorter wait times on Monday and Friday afternoons. The office is located at [address], [address], [place], [place] [address] No appointment is necessary.   Labs are drawn by [facility].  You may receive a bill from [facility] for your lab work. If you have any questions regarding directions or hours of operation,  please call [phone].    

## 2018-01-23 NOTE — Progress Notes (Signed)
Office Visit Note  Patient: Larry Salazar             Date of Birth: 05-19-1941           MRN: [id number]             PCP: Heron Nay, PA Referring: Heron Nay, PA Visit Date: 01/23/2018 Occupation: @    Subjective:  Medication Management   History of Present Illness: Cloyde Oregel is a 77 y.o. male with history of seronegative rheumatoid arthritis and osteoarthritis.  Patient states he has been off his methotrexate and folic acid for 3.5 weeks.  He was taking methotrexate 5 tablets weekly and folic acid 1 mg daily.  He denies any joint pain or joint swelling at this time.  He denies any recent flares of his rheumatoid arthritis.  He states occasionally will have some hip stiffness especially when he is getting out of bed in the morning.   Activities of Daily Living:  Patient reports morning stiffness for 5-10 minutes.   Patient Denies nocturnal pain.  Difficulty dressing/grooming: Denies Difficulty climbing stairs: Denies Difficulty getting out of chair: Reports Difficulty using hands for taps, buttons, cutlery, and/or writing: Denies   Review of Systems  Constitutional: Positive for fatigue. Negative for night sweats.  HENT: Negative for mouth sores, mouth dryness and nose dryness.   Eyes: Negative for redness and dryness.  Respiratory: Negative for cough, hemoptysis, shortness of breath and difficulty breathing.   Cardiovascular: Negative for chest pain, palpitations, hypertension, irregular heartbeat and swelling in legs/feet.  Gastrointestinal: Negative for blood in stool, constipation and diarrhea.  Endocrine: Negative for increased urination.  Genitourinary: Negative for painful urination.  Musculoskeletal: Negative for arthralgias, joint pain, joint swelling, myalgias, muscle weakness, morning stiffness, muscle tenderness and myalgias.  Skin: Negative for color change, rash, hair loss, nodules/bumps, skin tightness, ulcers and sensitivity to  sunlight.  Allergic/Immunologic: Negative for susceptible to infections.  Neurological: Negative for dizziness, fainting, memory loss, night sweats and weakness.  Hematological: Negative for swollen glands.  Psychiatric/Behavioral: Negative for depressed mood and sleep disturbance. The patient is not nervous/anxious.     PMFS History:  Patient Active Problem List   Diagnosis Date Noted  . Rheumatoid arthritis, seronegative, multiple sites (HCC) 05/05/2017  . High risk medications  long-term use 05/05/2017  . Needle phobia 05/05/2017  . History of renal insufficiency 05/05/2017  . Primary osteoarthritis of both hands 05/05/2017  . Primary osteoarthritis of both feet 05/05/2017  . History of biceps tendon repair  05/05/2017  . History of hypothyroidism 05/05/2017  . History of hyperlipidemia 05/05/2017  . Former smoker Quit 2017  05/05/2017    Past Medical History:  Diagnosis Date  . High cholesterol   . Hypothyroidism   . Rheumatoid arthritis (HCC)     History reviewed. No pertinent family history. Past Surgical History:  Procedure Laterality Date  . SHOULDER SURGERY Right    Social History   Social History Narrative  . Not on file     Objective: Vital Signs: BP (!) 145/86 (BP Location: Left Arm, Patient Position: Sitting, Cuff Size: Small)   Pulse (!) 51   Resp 12   Ht  (1.651 m)   Wt 182 lb (82.6 kg)   BMI 30.29 kg/m    Physical Exam  Constitutional: He is oriented to person, place, and time. He appears well-developed and well-nourished.  HENT:  Head: Normocephalic and atraumatic.  Eyes: Pupils are equal, round, and reactive to light. Conjunctivae  and EOM are normal.  Neck: Normal range of motion. Neck supple.  Cardiovascular: Normal rate, regular rhythm and normal heart sounds.  Pulmonary/Chest: Effort normal and breath sounds normal.  Abdominal: Soft. Bowel sounds are normal.  Lymphadenopathy:    He has no cervical adenopathy.  Neurological: He is  alert and oriented to person, place, and time.  Skin: Skin is warm and dry. Capillary refill takes less than 2 seconds.  Psychiatric: He has a normal mood and affect. His behavior is normal.  Nursing note and vitals reviewed.    Musculoskeletal Exam: C-spine, thoracic spine, and lumbar spine good ROM.  No midline spinal tenderness.  Shoulder joints, elbow joints, wrist joints, MCPs, PIPs, and DIPs good ROM with no synovitis.  PIP and DIP synovial thickening.  Hip joints, knee joints, ankle joints, MTPs, PIPs, and DIPs good ROM with no synovitis.  No warmth or effusion of knee joints.    CDAI Exam: No CDAI exam completed.    Investigation: No additional findings.   Imaging: No results found.  Speciality Comments: No specialty comments available.    Procedures:  No procedures performed Allergies: Patient has no known allergies.   Assessment / Plan:     Visit Diagnoses: Rheumatoid arthritis, seronegative, multiple sites [facility]): He has no synovitis on exam.  He has not had any recent rheumatoid arthritis flares.  He has no joint pain or joint swelling at this time.  He has been off of his methotrexate and folic acid for 3-1/2 weeks.  A refill of his methotrexate was sent to the pharmacy on 01/19/2018.  He will continue taking methotrexate 4 tablets daily and folic acid 1 mg daily.  He was advised to notify us if he develops increased joint pain or joint swelling.   High risk medications  long-term use -  Methotrexate 4 tablets by mouth every week, folic acid 1 mg by mouth daily.CBC and CMP were drawn today.   Primary osteoarthritis of both hands: PIP and DIP synovial thickening consistent with osteoarthritis.  Joint protection muscle strengthening were discussed.  Primary osteoarthritis of both feet: He has no discomfort at this time.  He wears proper fitting shoes.   Other medical conditions are listed as follows:  Needle phobia  History of renal insufficiency  History of  hypothyroidism  History of hyperlipidemia  History of biceps tendon repair   Former smoker Quit 2017      Orders: No orders of the defined types were placed in this encounter.  No orders of the defined types were placed in this encounter.   Follow-Up Instructions: Return in about 5 months (around 06/25/2018) for Rheumatoid arthritis, Osteoarthritis.   Gearldine Bienenstock, PA-C   I examined and evaluated the patient with Sherron Ales PA.  Patient had no synovitis on my examination he does have DIP thickening.  He had flared each time we tapered him off methotrexate completely.  He has been taking methotrexate only 5 tablets p.o. weekly.  We decided to lower him to 4 tablets p.o. weekly and observe.  The plan of care was discussed as noted above.  Pollyann Savoy, MD  Note - This record has been created using Animal nutritionist.  Chart creation errors have been sought, but may not always  have been located. Such creation errors do not reflect on  the standard of medical care.

## 2018-01-24 ENCOUNTER — Ambulatory Visit: Payer: Medicare Other | Admitting: Physician Assistant

## 2018-01-24 ENCOUNTER — Other Ambulatory Visit: Payer: Self-pay | Admitting: Rheumatology

## 2018-01-24 NOTE — Telephone Encounter (Signed)
Last Visit: 01/23/18 Next Visit: 06/27/18  Okay to refill per Dr. Corliss Skains

## 2018-01-24 NOTE — Progress Notes (Signed)
Cr is high. Advise to reduce MTX to 4 tabs po q wk. Repeat labs in 2 mths.

## 2018-02-13 ENCOUNTER — Telehealth: Payer: Self-pay | Admitting: Rheumatology

## 2018-02-13 NOTE — Telephone Encounter (Signed)
Left message to advise patient that Dr. Corliss Skains aware that he has increased his medication back to MTX 5 tabs weekly and we will just monitor labs for now. Advised to come have labs in 2 months.

## 2018-02-13 NOTE — Telephone Encounter (Signed)
Patient called stating that he had decreased his Methotrexate and Folic Acid to 1 pill per day.  Patient states that he had increased pain and swelling in his hands.  Patient increased his Methotrexate back to 5 pills and Folic Acid to 2 pills.  We decreased MTX due to Creat being 1.28 and GFR 54 which was previously normal.

## 2018-02-13 NOTE — Telephone Encounter (Signed)
Patient called stating that he had decreased his Methotrexate and Folic Acid to 1 pill per day.  Patient states that he had increased pain and swelling in his hands.  Patient increased his Methotrexate back to 5 pills and Folic Acid to 2 pills.

## 2018-02-13 NOTE — Telephone Encounter (Signed)
We will monitor for now.Repeat labs in 2 months.

## 2018-05-18 ENCOUNTER — Other Ambulatory Visit: Payer: Self-pay | Admitting: Rheumatology

## 2018-05-18 DIAGNOSIS — Z79899 Other long term (current) drug therapy: Secondary | ICD-10-CM

## 2018-05-18 NOTE — Telephone Encounter (Signed)
Last Visit: 01/23/18 Next Visit: 06/27/18 Labs: 01/23/18 Cr is high.   Patient advised he is due for labs. Patient will come 05/18/18 to update.

## 2018-05-19 ENCOUNTER — Other Ambulatory Visit: Payer: Self-pay | Admitting: *Deleted

## 2018-05-19 DIAGNOSIS — Z79899 Other long term (current) drug therapy: Secondary | ICD-10-CM

## 2018-05-19 NOTE — Telephone Encounter (Signed)
Patient came in for labs 05/19/18

## 2018-05-20 LAB — CBC WITH DIFFERENTIAL/PLATELET
BASOS PCT: 1.9 %
Basophils Absolute: 110 cells/uL (ref 0–200)
Eosinophils Absolute: 139 cells/uL (ref 15–500)
Eosinophils Relative: 2.4 %
HCT: 44.8 % (ref 38.5–50.0)
Hemoglobin: 14.9 g/dL (ref 13.2–17.1)
Lymphs Abs: 1119 cells/uL (ref 850–3900)
MCH: 32.5 pg (ref 27.0–33.0)
MCHC: 33.3 g/dL (ref 32.0–36.0)
MCV: 97.8 fL (ref 80.0–100.0)
MONOS PCT: 8.5 %
MPV: 10.8 fL (ref 7.5–12.5)
Neutro Abs: 3938 cells/uL (ref 1500–7800)
Neutrophils Relative %: 67.9 %
Platelets: 206 10*3/uL (ref 140–400)
RBC: 4.58 10*6/uL (ref 4.20–5.80)
RDW: 12.6 % (ref 11.0–15.0)
Total Lymphocyte: 19.3 %
WBC: 5.8 10*3/uL (ref 3.8–10.8)
WBCMIX: 493 {cells}/uL (ref 200–950)

## 2018-05-20 LAB — COMPLETE METABOLIC PANEL WITH GFR
AG Ratio: 1.4 (calc) (ref 1.0–2.5)
ALBUMIN MSPROF: 4.4 g/dL (ref 3.6–5.1)
ALT: 20 U/L (ref 9–46)
AST: 34 U/L (ref 10–35)
Alkaline phosphatase (APISO): 80 U/L (ref 40–115)
BUN/Creatinine Ratio: 12 (calc) (ref 6–22)
BUN: 16 mg/dL (ref 7–25)
CALCIUM: 10 mg/dL (ref 8.6–10.3)
CO2: 28 mmol/L (ref 20–32)
CREATININE: 1.29 mg/dL — AB (ref 0.70–1.18)
Chloride: 103 mmol/L (ref 98–110)
GFR, EST NON AFRICAN AMERICAN: 53 mL/min/{1.73_m2} — AB (ref >=60)
GFR, Est African American: 62 mL/min/{1.73_m2} (ref >=60)
GLOBULIN: 3.1 g/dL (ref 1.9–3.7)
GLUCOSE: 106 mg/dL — AB (ref 65–99)
Potassium: 4.9 mmol/L (ref 3.5–5.3)
SODIUM: 140 mmol/L (ref 135–146)
Total Bilirubin: 1.9 mg/dL — ABNORMAL HIGH (ref 0.2–1.2)
Total Protein: 7.5 g/dL (ref 6.1–8.1)

## 2018-06-13 NOTE — Progress Notes (Signed)
Office Visit Note  Patient: Larry Salazar Dizon             Date of Birth: 12-25-1940           MRN: [id number]             PCP: Heron NayYoung, Lauren E, PA Referring: Heron NayYoung, Lauren E, PA Visit Date: 06/27/2018 Occupation: @GUAROCC @  Subjective:  Medication monitoring   History of Present Illness: Larry Salazar Nappi is a 77 y.o. male with history of seronegative rheumatoid arthritis and osteoarthritis.  Patient is on methotrexate 5 tablets by mouth once weekly and folic acid 1 mg daily. He denies any recent flares.  He denies any joint pain or joint swelling.  He tried taking MTX 4 tablets by mouth once a week but he did not feel like it was effective enough so he has been taking 5 tablets by mouth once a week. He states he has had a few dizzy episodes lately. He has fallen several times. He discontinued taking Synthroid and statin therapy.  He does not see a PCP. He has not had his lipids or thyroid levels checked recently.      Activities of Daily Living:  Patient reports morning stiffness for 0  minutes.   Patient Denies nocturnal pain.  Difficulty dressing/grooming: Denies Difficulty climbing stairs: Denies Difficulty getting out of chair: Denies Difficulty using hands for taps, buttons, cutlery, and/or writing: Denies  Review of Systems  Constitutional: Negative for fatigue, night sweats and weight loss.  HENT: Negative for mouth sores, trouble swallowing, trouble swallowing, mouth dryness and nose dryness.   Eyes: Negative for redness, visual disturbance and dryness.  Respiratory: Negative for cough, hemoptysis, shortness of breath and difficulty breathing.   Cardiovascular: Negative for chest pain, palpitations, hypertension, irregular heartbeat and swelling in legs/feet.  Gastrointestinal: Negative for blood in stool, constipation and diarrhea.  Endocrine: Negative for increased urination.  Genitourinary: Negative for painful urination.  Musculoskeletal: Negative for arthralgias, joint  pain, joint swelling, myalgias, muscle weakness, morning stiffness, muscle tenderness and myalgias.  Skin: Negative for color change, rash, hair loss, nodules/bumps, skin tightness, ulcers and sensitivity to sunlight.  Allergic/Immunologic: Negative for susceptible to infections.  Neurological: Positive for dizziness. Negative for fainting, memory loss, night sweats and weakness.  Hematological: Negative for swollen glands.  Psychiatric/Behavioral: Negative for depressed mood and sleep disturbance. The patient is not nervous/anxious.     PMFS History:  Patient Active Problem List   Diagnosis Date Noted  . Rheumatoid arthritis, seronegative, multiple sites (HCC) 05/05/2017  . High risk medications  long-term use 05/05/2017  . Needle phobia 05/05/2017  . History of renal insufficiency 05/05/2017  . Primary osteoarthritis of both hands 05/05/2017  . Primary osteoarthritis of both feet 05/05/2017  . History of biceps tendon repair  05/05/2017  . History of hypothyroidism 05/05/2017  . History of hyperlipidemia 05/05/2017  . Former smoker Quit 2017  05/05/2017    Past Medical History:  Diagnosis Date  . High cholesterol   . Hypothyroidism   . Rheumatoid arthritis (HCC)     Family History  Problem Relation Age of Onset  . Heart attack Father   . Hypertension Father   . Diabetes Brother   . Diabetes Sister   . Diabetes Sister   . Heart attack Son   . Healthy Son   . Healthy Daughter    Past Surgical History:  Procedure Laterality Date  . SHOULDER SURGERY Right    Social History   Social History Narrative  .  Not on file    Objective: Vital Signs: BP (!) 143/98 (BP Location: Left Arm, Patient Position: Sitting, Cuff Size: Normal)   Pulse 72   Resp 14   Ht 5\' 5"  (1.651 m)   Wt 177 lb (80.3 kg)   BMI 29.45 kg/m    Physical Exam  Constitutional: He is oriented to person, place, and time. He appears well-developed and well-nourished.  HENT:  Head: Normocephalic and  atraumatic.  No parotid swelling.  Eyes: Pupils are equal, round, and reactive to light. Conjunctivae and EOM are normal.  Neck: Normal range of motion. Neck supple.  Cardiovascular: Normal rate, regular rhythm and normal heart sounds.  Pulmonary/Chest: Effort normal and breath sounds normal.  Abdominal: Soft. Bowel sounds are normal.  Lymphadenopathy:    He has no cervical adenopathy.  Neurological: He is alert and oriented to person, place, and time.  Skin: Skin is warm and dry. Capillary refill takes less than 2 seconds.  Psychiatric: He has a normal mood and affect. His behavior is normal.  Nursing note and vitals reviewed.    Musculoskeletal Exam: C-spine, thoracic sine, and lumbar spine good ROM.  No midline spinal tenderness.  No SI joint tenderness.  Shoulder joints, elbow joints, wrist joints, MCPs, PIPs, and DIPs good ROM with no synovitis.  Complete fist formation bilaterally. Right medial epicondylitis.  Hip joints, knee joints, ankle joints, MTPs, PIPs, and DIPs good ROM with no synovitis.  No warmth or effusion of knee joints.  No tenderness and swelling of ankle joints.     CDAI Exam: CDAI Score: 0.2  Patient Global Assessment: 1 (mm); Provider Global Assessment: 1 (mm) Swollen: 0 ; Tender: 0  Joint Exam   Not documented   There is currently no information documented on the homunculus. Go to the Rheumatology activity and complete the homunculus joint exam.  Investigation: No additional findings.  Imaging: No results found.  Recent Labs: Lab Results  Component Value Date   WBC 5.8 05/19/2018   HGB 14.9 05/19/2018   PLT 206 05/19/2018   NA 140 05/19/2018   K 4.9 05/19/2018   CL 103 05/19/2018   CO2 28 05/19/2018   GLUCOSE 106 (H) 05/19/2018   BUN 16 05/19/2018   CREATININE 1.29 (H) 05/19/2018   BILITOT 1.9 (H) 05/19/2018   ALKPHOS 72 04/29/2017   AST 34 05/19/2018   ALT 20 05/19/2018   PROT 7.5 05/19/2018   ALBUMIN 4.1 04/29/2017   CALCIUM 10.0  05/19/2018   GFRAA 62 05/19/2018    Speciality Comments: No specialty comments available.  Procedures:  No procedures performed Allergies: Patient has no known allergies.   Assessment / Plan:     Visit Diagnoses: Rheumatoid arthritis, seronegative, multiple sites [facility]): He has no synovitis on exam.  He has clinically been doing well on MTX 5 tablets by mouth once a week and folic acid 1 mg po daily.  He has not had any recent rheumatoid arthritis flares.  He has no joint pain or joint swelling.  He has no joint stiffness.  He does not need any refills at this time.  He was advised to notify us if he develops increased joint pain and joint swelling.  He will follow up in 5 months.   High risk medications  long-term use - Methotrexate 5 tablets by mouth every week, folic acid 1 mg by mouth daily-CBC and CMP will be checked today to monitor for drug toxicity.  He will return in January and every 3 months  for lab work.- Plan: COMPLETE METABOLIC PANEL WITH GFR, CBC with Differential/Platelet  Primary osteoarthritis of both hands: He has mild PIP and DIP synovial thickening consistent with osteoarthritis of bilateral hands.  He is complete fist formation bilaterally.  He has no synovitis on exam.  Joint protection and muscle strengthening were discussed.  Primary osteoarthritis of both feet: He has no discomfort in his feet at this time.  He wears proper fitting shoes.    History of hypothyroidism -He was previously taking Synthroid on a daily basis but discontinued on his own.  He does not have a PCP at this time.  We discussed establishing care with a primary care soon as possible.  We will try establishing care with his wife's PCP.  We discussed the risk of stopping Synthroid abruptly.  We will check a TSH level today.  Plan: TSH  History of hyperlipidemia -he discontinued statin therapy on exam.  We discussed the importance of discussing this with her primary care provider.  We will check a lipid  level today since he is fasting.  We will forward results to his PCP once he establishes care.  Plan: Lipid panel  Other medical conditions are listed as follows:  History of biceps tendon repair   Needle phobia  History of renal insufficiency  Former smoker Quit 2017   Dizzy spells   Orders: Orders Placed This Encounter  Procedures  . TSH  . Lipid panel  . COMPLETE METABOLIC PANEL WITH GFR  . CBC with Differential/Platelet   No orders of the defined types were placed in this encounter.   Face-to-face time spent with patient was 30 minutes. Greater than 50% of time was spent in counseling and coordination of care.  Follow-Up Instructions: Return in about 5 months (around 11/26/2018) for Rheumatoid arthritis.   Gearldine Bienenstock, PA-C   I examined and evaluated the patient with Sherron Ales PA.  Patient was complete by his wife today ,who was concerned about multiple problems as listed above.  He had no synovitis on examination.  He does have osteoarthritis which causes discomfort.  We addressed several issues for which he will schedule an appointment with the primary care physician.  I will obtain labs which are needed prior to his appointment.  We also offered referral to neurologist due to dizziness but he declined.  He would like to see the primary care physician prior to that referral.  The plan of care was discussed as noted above.  Pollyann Savoy, MD  Note - This record has been created using Animal nutritionist.  Chart creation errors have been sought, but may not always  have been located. Such creation errors do not reflect on  the standard of medical care.

## 2018-06-27 ENCOUNTER — Encounter: Payer: Self-pay | Admitting: Physician Assistant

## 2018-06-27 ENCOUNTER — Ambulatory Visit (INDEPENDENT_AMBULATORY_CARE_PROVIDER_SITE_OTHER): Payer: Medicare Other | Admitting: Rheumatology

## 2018-06-27 VITALS — BP 143/98 | HR 72 | Resp 14 | Ht 65.0 in | Wt 177.0 lb

## 2018-06-27 DIAGNOSIS — M19071 Primary osteoarthritis, right ankle and foot: Secondary | ICD-10-CM

## 2018-06-27 DIAGNOSIS — M19072 Primary osteoarthritis, left ankle and foot: Secondary | ICD-10-CM

## 2018-06-27 DIAGNOSIS — M0609 Rheumatoid arthritis without rheumatoid factor, multiple sites: Secondary | ICD-10-CM

## 2018-06-27 DIAGNOSIS — Z87448 Personal history of other diseases of urinary system: Secondary | ICD-10-CM

## 2018-06-27 DIAGNOSIS — F40298 Other specified phobia: Secondary | ICD-10-CM

## 2018-06-27 DIAGNOSIS — Z8639 Personal history of other endocrine, nutritional and metabolic disease: Secondary | ICD-10-CM

## 2018-06-27 DIAGNOSIS — M19041 Primary osteoarthritis, right hand: Secondary | ICD-10-CM

## 2018-06-27 DIAGNOSIS — Z79899 Other long term (current) drug therapy: Secondary | ICD-10-CM

## 2018-06-27 DIAGNOSIS — M19042 Primary osteoarthritis, left hand: Secondary | ICD-10-CM

## 2018-06-27 DIAGNOSIS — Z9889 Other specified postprocedural states: Secondary | ICD-10-CM

## 2018-06-27 DIAGNOSIS — Z87891 Personal history of nicotine dependence: Secondary | ICD-10-CM

## 2018-06-27 DIAGNOSIS — R42 Dizziness and giddiness: Secondary | ICD-10-CM

## 2018-06-27 LAB — CBC WITH DIFFERENTIAL/PLATELET
BASOS ABS: 83 {cells}/uL (ref 0–200)
BASOS PCT: 1.1 %
EOS ABS: 113 {cells}/uL (ref 15–500)
Eosinophils Relative: 1.5 %
HCT: 43.9 % (ref 38.5–50.0)
HEMOGLOBIN: 15.1 g/dL (ref 13.2–17.1)
LYMPHS ABS: 1253 {cells}/uL (ref 850–3900)
MCH: 32.8 pg (ref 27.0–33.0)
MCHC: 34.4 g/dL (ref 32.0–36.0)
MCV: 95.4 fL (ref 80.0–100.0)
MPV: 10.6 fL (ref 7.5–12.5)
Monocytes Relative: 9 %
Neutro Abs: 5378 cells/uL (ref 1500–7800)
Neutrophils Relative %: 71.7 %
Platelets: 215 10*3/uL (ref 140–400)
RBC: 4.6 10*6/uL (ref 4.20–5.80)
RDW: 12.7 % (ref 11.0–15.0)
Total Lymphocyte: 16.7 %
WBC: 7.5 10*3/uL (ref 3.8–10.8)
WBCMIX: 675 {cells}/uL (ref 200–950)

## 2018-06-27 LAB — COMPLETE METABOLIC PANEL WITH GFR
AG Ratio: 1.6 (calc) (ref 1.0–2.5)
ALBUMIN MSPROF: 4.7 g/dL (ref 3.6–5.1)
ALT: 25 U/L (ref 9–46)
AST: 41 U/L — ABNORMAL HIGH (ref 10–35)
Alkaline phosphatase (APISO): 79 U/L (ref 40–115)
BILIRUBIN TOTAL: 1.8 mg/dL — AB (ref 0.2–1.2)
BUN / CREAT RATIO: 15 (calc) (ref 6–22)
BUN: 21 mg/dL (ref 7–25)
CALCIUM: 10.2 mg/dL (ref 8.6–10.3)
CHLORIDE: 102 mmol/L (ref 98–110)
CO2: 26 mmol/L (ref 20–32)
Creat: 1.38 mg/dL — ABNORMAL HIGH (ref 0.70–1.18)
GFR, EST AFRICAN AMERICAN: 57 mL/min/{1.73_m2} — AB (ref >=60)
GFR, EST NON AFRICAN AMERICAN: 49 mL/min/{1.73_m2} — AB (ref >=60)
GLUCOSE: 92 mg/dL (ref 65–99)
Globulin: 3 g/dL (calc) (ref 1.9–3.7)
Potassium: 4.6 mmol/L (ref 3.5–5.3)
Sodium: 140 mmol/L (ref 135–146)
TOTAL PROTEIN: 7.7 g/dL (ref 6.1–8.1)

## 2018-06-27 LAB — LIPID PANEL
Cholesterol: 204 mg/dL — ABNORMAL HIGH (ref <200)
HDL: 57 mg/dL (ref >40)
LDL Cholesterol (Calc): 124 mg/dL (calc) — ABNORMAL HIGH
Non-HDL Cholesterol (Calc): 147 mg/dL (calc) — ABNORMAL HIGH (ref <130)
TRIGLYCERIDES: 119 mg/dL (ref <150)
Total CHOL/HDL Ratio: 3.6 (calc) (ref <5.0)

## 2018-06-27 LAB — TSH: TSH: 26.12 m[IU]/L — AB (ref 0.40–4.50)

## 2018-06-27 NOTE — Patient Instructions (Addendum)
Standing Labs We placed an order today for your standing lab work.    Please come back and get your standing labs in January and every 3 months   We have open lab Monday through Friday from 8:30-11:30 AM and 1:30-4:00 PM  at the office of Dr. Shaili Deveshwar.   You may experience shorter wait times on Monday and Friday afternoons. The office is located at [address], [address], [place], [place] [address] No appointment is necessary.   Labs are drawn by [facility].  You may receive a bill from [facility] for your lab work. If you have any questions regarding directions or hours of operation,  please call [phone].   Just as a reminder please drink plenty of water prior to coming for your lab work. Thanks!   

## 2018-06-28 NOTE — Progress Notes (Signed)
TSH is elevated.  He has history of hypothyroidism and discontinued synthroid recently. Cholesterol and LDL mildly elevated. He discontinued statin therapy recently. Creatinine is elevated and AST is elevated.  Please advise patient to decrease dose of MTX to 4 tablets by mouth once a week.  CBC WNL.  Please advise patient to establish with a PCP ASAP.  Please mail lab results to patient so he can take them to next PCP appointment.

## 2018-09-16 ENCOUNTER — Other Ambulatory Visit: Payer: Self-pay | Admitting: Rheumatology

## 2018-09-18 NOTE — Telephone Encounter (Signed)
Last visit: 06/27/18 Next visit: 11/29/18 Labs: 06/27/18 Creatinine is elevated and AST is elevated. decrease dose of MTX to 4 tablets by mouth once a week. CBC WNL.   Okay to refill per Dr. Corliss Skainseveshwar

## 2018-11-15 NOTE — Progress Notes (Signed)
Office Visit Note  Patient: Larry Salazar             Date of Birth: 26-Jan-1941           MRN: [id number]             PCP: Heron NayYoung, Lauren E, PA Referring: Heron NayYoung, Lauren E, PA Visit Date: 11/29/2018 Occupation: @GUAROCC @  Subjective:  Right knee pain   History of Present Illness: Larry Salazar is a 78 y.o. male with history of seronegative rheumatoid arthritis and osteoarthritis.  He is taking MTX 4 tablets po once weekly and folic acid 1 mg po daily.  He denies any recent rheumatoid arthritis flares.  He reports that he is having pain in the right knee joint that started this morning.  He states that he woke up with the pain.  He denies any injuries or falls recently.  He denies any overuse activities.  He denies any joint swelling.  He denies any other joint pain or joint swelling at this time.  He denies any morning stiffness.  Activities of Daily Living:  Patient reports morning stiffness for 0 minutes.   Patient Denies nocturnal pain.  Difficulty dressing/grooming: Denies Difficulty climbing stairs: Denies Difficulty getting out of chair: Denies Difficulty using hands for taps, buttons, cutlery, and/or writing: Reports  Review of Systems  Constitutional: Positive for fatigue. Negative for night sweats.  HENT: Negative for mouth sores, mouth dryness and nose dryness.   Eyes: Positive for dryness. Negative for redness and visual disturbance.  Respiratory: Negative for cough, hemoptysis, shortness of breath and difficulty breathing.   Cardiovascular: Negative for chest pain, palpitations, hypertension, irregular heartbeat and swelling in legs/feet.  Gastrointestinal: Negative for blood in stool, constipation and diarrhea.  Endocrine: Negative for increased urination.  Genitourinary: Negative for painful urination.  Musculoskeletal: Positive for arthralgias and joint pain. Negative for joint swelling, myalgias, muscle weakness, morning stiffness, muscle tenderness and myalgias.    Skin: Positive for rash. Negative for color change, hair loss, nodules/bumps, skin tightness, ulcers and sensitivity to sunlight.  Allergic/Immunologic: Negative for susceptible to infections.  Neurological: Positive for light-headedness. Negative for dizziness, fainting, memory loss, night sweats and weakness.  Hematological: Negative for swollen glands.  Psychiatric/Behavioral: Negative for depressed mood and sleep disturbance. The patient is not nervous/anxious.     PMFS History:  Patient Active Problem List   Diagnosis Date Noted  . Rheumatoid arthritis, seronegative, multiple sites (HCC) 05/05/2017  . High risk medications  long-term use 05/05/2017  . Needle phobia 05/05/2017  . History of renal insufficiency 05/05/2017  . Primary osteoarthritis of both hands 05/05/2017  . Primary osteoarthritis of both feet 05/05/2017  . History of biceps tendon repair  05/05/2017  . History of hypothyroidism 05/05/2017  . History of hyperlipidemia 05/05/2017  . Former smoker Quit 2017  05/05/2017    Past Medical History:  Diagnosis Date  . High cholesterol   . Hypothyroidism   . Rheumatoid arthritis (HCC)     Family History  Problem Relation Age of Onset  . Heart attack Father   . Hypertension Father   . Diabetes Brother   . Diabetes Sister   . Diabetes Sister   . Heart attack Son   . Healthy Son   . Healthy Daughter    Past Surgical History:  Procedure Laterality Date  . SHOULDER SURGERY Right    Social History   Social History Narrative  . Not on file    There is no immunization history on file  for this patient.   Objective: Vital Signs: BP 137/86 (BP Location: Left Arm, Patient Position: Sitting, Cuff Size: Small)   Pulse 77   Resp 14   Ht 5\' 5"  (1.651 m)   Wt 183 lb (83 kg)   BMI 30.45 kg/m    Physical Exam Vitals signs and nursing note reviewed.  Constitutional:      Appearance: He is well-developed.  HENT:     Head: Normocephalic and atraumatic.  Eyes:      Conjunctiva/sclera: Conjunctivae normal.     Pupils: Pupils are equal, round, and reactive to light.  Neck:     Musculoskeletal: Normal range of motion and neck supple.  Cardiovascular:     Rate and Rhythm: Normal rate and regular rhythm.     Heart sounds: Normal heart sounds.  Pulmonary:     Effort: Pulmonary effort is normal.     Breath sounds: Normal breath sounds.  Abdominal:     General: Bowel sounds are normal.     Palpations: Abdomen is soft.  Lymphadenopathy:     Cervical: No cervical adenopathy.  Skin:    General: Skin is warm and dry.     Capillary Refill: Capillary refill takes less than 2 seconds.  Neurological:     Mental Status: He is alert and oriented to person, place, and time.  Psychiatric:        Behavior: Behavior normal.      Musculoskeletal Exam: C-spine, thoracic spine, lumbar spine good range of motion.  No midline spinal tenderness.  No SI joint tenderness.  Shoulder joints, elbow joints, wrist joints, MCPs, PIPs, DIPs good range of motion with no synovitis.  He has complete fist formation bilaterally.  Hip joints, knee joints, ankle joints, MTPs, PIPs, DIPs good range of motion with no synovitis.  No warmth or effusion bilateral knee joints.  He has right prepatellar bursitis.  No tenderness or swelling of ankle joints.  No Achilles tenderness or plantar fasciitis.  No tenderness over trochanteric bursa bilaterally.  CDAI Exam: CDAI Score: Not documented Patient Global Assessment: Not documented; Provider Global Assessment: Not documented Swollen: Not documented; Tender: Not documented Joint Exam   Not documented   There is currently no information documented on the homunculus. Go to the Rheumatology activity and complete the homunculus joint exam.  Investigation: No additional findings.  Imaging: No results found.  Recent Labs: Lab Results  Component Value Date   WBC 7.5 06/27/2018   HGB 15.1 06/27/2018   PLT 215 06/27/2018   NA 140  06/27/2018   K 4.6 06/27/2018   CL 102 06/27/2018   CO2 26 06/27/2018   GLUCOSE 92 06/27/2018   BUN 21 06/27/2018   CREATININE 1.38 (H) 06/27/2018   BILITOT 1.8 (H) 06/27/2018   ALKPHOS 72 04/29/2017   AST 41 (H) 06/27/2018   ALT 25 06/27/2018   PROT 7.7 06/27/2018   ALBUMIN 4.1 04/29/2017   CALCIUM 10.2 06/27/2018   GFRAA 57 (L) 06/27/2018    Speciality Comments: No specialty comments available.  Procedures:  No procedures performed Allergies: Patient has no known allergies.   Assessment / Plan:     Visit Diagnoses: Rheumatoid arthritis, seronegative, multiple sites [facility]): He has no synovitis on exam.  He has not had any recent rheumatoid arthritis flares.  He is clinically doing well on methotrexate 4 tablets by mouth once weekly and folic acid 1 mg by mouth daily.  He presents today with right prepatellar bursitis that started this morning.  He  has no warmth or effusion of the right knee joint on exam.  We discussed the use of Aspercreme for symptomatic relief.  His creatinine and LFTs were elevated with his lab work drawn on 06/27/2018 so we opted out of prescribing Voltaren gel.  We will continue methotrexate 4 tablets by mouth once weekly and folic acid 1 mg by mouth daily.  A refill of methotrexate was sent to the pharmacy today.  He was advised to notify us if he develops increased joint pain or joint swelling.  He will follow-up in the office in 5 months.  High risk medications  long-term use -Methotrexate 4 tablets weekly and folic acid 1 mg 2 tablets daily.  Last CBC within normal limits and CMP showed elevated creatinine and AST on 06/27/2018.  He is overdue for labs.  CBC/CMP ordered for today and will monitor every 3 months.  Standing orders are in place.  - Plan: CBC with Differential/Platelet, COMPLETE METABOLIC PANEL WITH GFR  Primary osteoarthritis of both hands: He has mild PIP and DIP synovial thickening consistent with osteoarthritis of bilateral hands.  He has no  synovitis on exam.  He has complete fist formation bilaterally.  Joint protection and muscle strengthening were discussed.  Primary osteoarthritis of both feet: He has no discomfort at this time.  He was proper fitting shoes.  History of biceps tendon repair: He has good range of motion with no discomfort.  Other medical conditions are listed as follows:  Needle phobia  History of renal insufficiency  History of hypothyroidism  History of hyperlipidemia  Former smoker Quit 2017    Orders: Orders Placed This Encounter  Procedures  . CBC with Differential/Platelet  . COMPLETE METABOLIC PANEL WITH GFR   Meds ordered this encounter  Medications  . methotrexate (RHEUMATREX) 2.5 MG tablet    Sig: Take 4 tablets (10 mg total) by mouth once a week. CAUTION:CHEMOTHERAPY.  PROTECT FROM LIGHT.    Dispense:  48 tablet    Refill:  0      Follow-Up Instructions: Return in about 5 months (around 05/01/2019) for Rheumatoid arthritis, Osteoarthritis.   Gearldine Bienenstock, PA-C  Note - This record has been created using Dragon software.  Chart creation errors have been sought, but may not always  have been located. Such creation errors do not reflect on  the standard of medical care.

## 2018-11-29 ENCOUNTER — Ambulatory Visit (INDEPENDENT_AMBULATORY_CARE_PROVIDER_SITE_OTHER): Payer: Medicare Other | Admitting: Physician Assistant

## 2018-11-29 ENCOUNTER — Encounter: Payer: Self-pay | Admitting: Physician Assistant

## 2018-11-29 VITALS — BP 137/86 | HR 77 | Resp 14 | Ht 65.0 in | Wt 183.0 lb

## 2018-11-29 DIAGNOSIS — Z79899 Other long term (current) drug therapy: Secondary | ICD-10-CM | POA: Diagnosis not present

## 2018-11-29 DIAGNOSIS — M0609 Rheumatoid arthritis without rheumatoid factor, multiple sites: Secondary | ICD-10-CM | POA: Diagnosis not present

## 2018-11-29 DIAGNOSIS — Z87891 Personal history of nicotine dependence: Secondary | ICD-10-CM

## 2018-11-29 DIAGNOSIS — Z87448 Personal history of other diseases of urinary system: Secondary | ICD-10-CM

## 2018-11-29 DIAGNOSIS — M19041 Primary osteoarthritis, right hand: Secondary | ICD-10-CM | POA: Diagnosis not present

## 2018-11-29 DIAGNOSIS — M7041 Prepatellar bursitis, right knee: Secondary | ICD-10-CM | POA: Diagnosis not present

## 2018-11-29 DIAGNOSIS — M19071 Primary osteoarthritis, right ankle and foot: Secondary | ICD-10-CM

## 2018-11-29 DIAGNOSIS — Z8639 Personal history of other endocrine, nutritional and metabolic disease: Secondary | ICD-10-CM

## 2018-11-29 DIAGNOSIS — Z9889 Other specified postprocedural states: Secondary | ICD-10-CM

## 2018-11-29 DIAGNOSIS — M19042 Primary osteoarthritis, left hand: Secondary | ICD-10-CM

## 2018-11-29 DIAGNOSIS — M19072 Primary osteoarthritis, left ankle and foot: Secondary | ICD-10-CM

## 2018-11-29 DIAGNOSIS — F40298 Other specified phobia: Secondary | ICD-10-CM

## 2018-11-29 MED ORDER — METHOTREXATE 2.5 MG PO TABS: 10.0000 mg | ORAL_TABLET | ORAL | 0 refills | Status: DC

## 2018-11-30 LAB — COMPLETE METABOLIC PANEL WITHOUT GFR
AG Ratio: 1.5 (calc) (ref 1.0–2.5)
ALT: 29 U/L (ref 9–46)
AST: 41 U/L — ABNORMAL HIGH (ref 10–35)
Albumin: 4.3 g/dL (ref 3.6–5.1)
Alkaline phosphatase (APISO): 88 U/L (ref 35–144)
BUN: 15 mg/dL (ref 7–25)
CO2: 27 mmol/L (ref 20–32)
Calcium: 9.4 mg/dL (ref 8.6–10.3)
Chloride: 107 mmol/L (ref 98–110)
Creat: 1.18 mg/dL (ref 0.70–1.18)
GFR, Est African American: 69 mL/min/1.73m2
GFR, Est Non African American: 60 mL/min/1.73m2
Globulin: 2.8 g/dL (ref 1.9–3.7)
Glucose, Bld: 98 mg/dL (ref 65–99)
Potassium: 4.4 mmol/L (ref 3.5–5.3)
Sodium: 140 mmol/L (ref 135–146)
Total Bilirubin: 1.7 mg/dL — ABNORMAL HIGH (ref 0.2–1.2)
Total Protein: 7.1 g/dL (ref 6.1–8.1)

## 2018-11-30 LAB — CBC WITH DIFFERENTIAL/PLATELET
Absolute Monocytes: 447 {cells}/uL (ref 200–950)
Basophils Absolute: 82 {cells}/uL (ref 0–200)
Basophils Relative: 1.3 %
Eosinophils Absolute: 158 {cells}/uL (ref 15–500)
Eosinophils Relative: 2.5 %
HCT: 42.5 % (ref 38.5–50.0)
Hemoglobin: 14.6 g/dL (ref 13.2–17.1)
Lymphs Abs: 1430 {cells}/uL (ref 850–3900)
MCH: 33.6 pg — ABNORMAL HIGH (ref 27.0–33.0)
MCHC: 34.4 g/dL (ref 32.0–36.0)
MCV: 97.9 fL (ref 80.0–100.0)
MPV: 10.3 fL (ref 7.5–12.5)
Monocytes Relative: 7.1 %
Neutro Abs: 4183 {cells}/uL (ref 1500–7800)
Neutrophils Relative %: 66.4 %
Platelets: 205 Thousand/uL (ref 140–400)
RBC: 4.34 Million/uL (ref 4.20–5.80)
RDW: 12.8 % (ref 11.0–15.0)
Total Lymphocyte: 22.7 %
WBC: 6.3 Thousand/uL (ref 3.8–10.8)

## 2018-11-30 NOTE — Progress Notes (Signed)
ALT is elevated-41 but stable.  We will continue to monitor.  Please advise patient to avoid NSAIDs, tylenol, and alcohol.  Rest of lab work is stable.

## 2018-12-01 ENCOUNTER — Telehealth: Payer: Self-pay | Admitting: Rheumatology

## 2018-12-01 MED ORDER — METHOTREXATE 2.5 MG PO TABS: 10.0000 mg | ORAL_TABLET | ORAL | 0 refills | Status: DC

## 2018-12-01 NOTE — Telephone Encounter (Signed)
Prescription resent to [facility]. Called [facility] and cancelled MTX prescription. Attempted to contact the patient and left message for patient to call the office .

## 2018-12-01 NOTE — Telephone Encounter (Signed)
Patient called stating his prescription of Methotrexate was sent to [facility].  Patient states he needs the prescription to be cancelled at [facility] and a 90 day supply sent to [facility].

## 2019-02-22 ENCOUNTER — Other Ambulatory Visit: Payer: Self-pay | Admitting: Rheumatology

## 2019-02-22 NOTE — Telephone Encounter (Addendum)
Last Visit: 11/29/18 Next Visit: 05/03/19 Labs: 11/29/18 ALT is elevated-41 but stable. Rest of lab work is stable.  Okay to refill per Dr. Corliss Skains

## 2019-03-15 ENCOUNTER — Other Ambulatory Visit: Payer: Self-pay | Admitting: Rheumatology

## 2019-03-15 NOTE — Telephone Encounter (Signed)
Last Visit: 11/29/18 Next Visit: 05/03/19  Okay to refill per Dr. Estanislado Pandy

## 2019-04-19 NOTE — Progress Notes (Signed)
Office Visit Note  Patient: Larry NoeDavid Overfelt             Date of Birth: 29-May-1941           MRN: [id number]             PCP: Heron NayYoung, Lauren E, PA Referring: Heron NayYoung, Lauren E, PA Visit Date: 05/03/2019 Occupation: @GUAROCC @  Subjective:  Medication monitoring.   History of Present Illness: Larry Salazar is a 78 y.o. male with history of rheumatoid arthritis and osteoarthritis.  He denies having any joint pain or joint swelling.  He has been taking methotrexate 4 tablets/week along with folic acid.  He has some stiffness in his hands off and on due to underlying osteoarthritis.  Activities of Daily Living:  Patient reports morning stiffness for 0 minute.   Patient Denies nocturnal pain.  Difficulty dressing/grooming: Denies Difficulty climbing stairs: Denies Difficulty getting out of chair: Denies Difficulty using hands for taps, buttons, cutlery, and/or writing: Denies  Review of Systems  Constitutional: Negative for fatigue and night sweats.  HENT: Negative for mouth sores, mouth dryness and nose dryness.   Eyes: Negative for redness and dryness.  Respiratory: Negative for shortness of breath and difficulty breathing.   Cardiovascular: Negative for chest pain, palpitations, hypertension, irregular heartbeat and swelling in legs/feet.  Gastrointestinal: Negative for constipation and diarrhea.  Endocrine: Negative for increased urination.  Musculoskeletal: Negative for arthralgias, joint pain, joint swelling, myalgias, muscle weakness, morning stiffness, muscle tenderness and myalgias.  Skin: Negative for color change, rash, hair loss, nodules/bumps, skin tightness, ulcers and sensitivity to sunlight.  Allergic/Immunologic: Negative for susceptible to infections.  Neurological: Negative for dizziness, fainting, memory loss, night sweats and weakness ( ).  Hematological: Negative for swollen glands.  Psychiatric/Behavioral: Negative for depressed mood and sleep disturbance. The  patient is not nervous/anxious.     PMFS History:  Patient Active Problem List   Diagnosis Date Noted  . Rheumatoid arthritis, seronegative, multiple sites (HCC) 05/05/2017  . High risk medications  long-term use 05/05/2017  . Needle phobia 05/05/2017  . History of renal insufficiency 05/05/2017  . Primary osteoarthritis of both hands 05/05/2017  . Primary osteoarthritis of both feet 05/05/2017  . History of biceps tendon repair  05/05/2017  . History of hypothyroidism 05/05/2017  . History of hyperlipidemia 05/05/2017  . Former smoker Quit 2017  05/05/2017    Past Medical History:  Diagnosis Date  . High cholesterol   . Hypothyroidism   . Rheumatoid arthritis (HCC)     Family History  Problem Relation Age of Onset  . Heart attack Father   . Hypertension Father   . Diabetes Brother   . Diabetes Sister   . Diabetes Sister   . Heart attack Son   . Healthy Son   . Healthy Daughter    Past Surgical History:  Procedure Laterality Date  . SHOULDER SURGERY Right    Social History   Social History Narrative  . Not on file    There is no immunization history on file for this patient.   Objective: Vital Signs: BP (!) 138/93 (BP Location: Left Arm, Patient Position: Sitting, Cuff Size: Normal)   Pulse 67   Resp 14   Ht 5\' 5"  (1.651 m)   Wt 177 lb (80.3 kg)   BMI 29.45 kg/m    Physical Exam Vitals signs and nursing note reviewed.  Constitutional:      Appearance: He is well-developed.  HENT:     Head: Normocephalic and  atraumatic.  Eyes:     Conjunctiva/sclera: Conjunctivae normal.     Pupils: Pupils are equal, round, and reactive to light.  Neck:     Musculoskeletal: Normal range of motion and neck supple.  Cardiovascular:     Rate and Rhythm: Normal rate and regular rhythm.     Heart sounds: Normal heart sounds.  Pulmonary:     Effort: Pulmonary effort is normal.     Breath sounds: Normal breath sounds.  Abdominal:     General: Bowel sounds are normal.      Palpations: Abdomen is soft.  Skin:    General: Skin is warm and dry.     Capillary Refill: Capillary refill takes less than 2 seconds.  Neurological:     Mental Status: He is alert and oriented to person, place, and time.  Psychiatric:        Behavior: Behavior normal.      Musculoskeletal Exam: C-spine, shoulder joints, elbow joints were in good range of motion.  He has DIP and PIP thickening in his hands.  He has MCP thickening but no synovitis was noted.  Hip joints, knee joints, MTPs PIPs with good range of motion with no synovitis.  CDAI Exam: CDAI Score: 0.2  Patient Global: 1 mm; Provider Global: 1 mm Swollen: 0 ; Tender: 0  Joint Exam   No joint exam has been documented for this visit   There is currently no information documented on the homunculus. Go to the Rheumatology activity and complete the homunculus joint exam.  Investigation: No additional findings.  Imaging: No results found.  Recent Labs: Lab Results  Component Value Date   WBC 6.3 11/29/2018   HGB 14.6 11/29/2018   PLT 205 11/29/2018   NA 140 11/29/2018   K 4.4 11/29/2018   CL 107 11/29/2018   CO2 27 11/29/2018   GLUCOSE 98 11/29/2018   BUN 15 11/29/2018   CREATININE 1.18 11/29/2018   BILITOT 1.7 (H) 11/29/2018   ALKPHOS 72 04/29/2017   AST 41 (H) 11/29/2018   ALT 29 11/29/2018   PROT 7.1 11/29/2018   ALBUMIN 4.1 04/29/2017   CALCIUM 9.4 11/29/2018   GFRAA 69 11/29/2018    Speciality Comments: No specialty comments available.  Procedures:  No procedures performed Allergies: Patient has no known allergies.   Assessment / Plan:     Visit Diagnoses: Rheumatoid arthritis, seronegative, multiple sites [facility]) -patient has been doing well on methotrexate 4 tablets/week.  He had no synovitis on examination.  He discussed possibly getting off the methotrexate.  He has tried tapering methotrexate in the past and had flare each time in the past.  We decided to keep him on methotrexate 4  tablets/week for now.  High risk medications  long-term use - Methotrexate 4 tablets weekly and folic acid 1 mg 2 tablets daily.  I have advised him to reduce folic acid to 1 mg p.o. daily.  He has not had labs in time due to pandemic.  We will check labs today and then every my 3 months to monitor for drug toxicity.- Plan: CBC with Differential/Platelet, COMPLETE METABOLIC PANEL WITH GFR,   Primary osteoarthritis of both hands - Plan: Joint protection was discussed.  Primary osteoarthritis of both feet -he is currently not having much feet discomfort.  History of biceps tendon repair    Needle phobia   History of renal insufficiency  History of hypothyroidism   History of hyperlipidemia   Former smoker Quit 2017    Orders:  Orders Placed This Encounter  Procedures  . CBC with Differential/Platelet  . COMPLETE METABOLIC PANEL WITH GFR   No orders of the defined types were placed in this encounter.     Follow-Up Instructions: Return in about 5 months (around 10/03/2019) for Rheumatoid arthritis.   Pollyann SavoyShaili Raphaella Larkin, MD  Note - This record has been created using Animal nutritionistDragon software.  Chart creation errors have been sought, but may not always  have been located. Such creation errors do not reflect on  the standard of medical care.

## 2019-05-03 ENCOUNTER — Encounter (INDEPENDENT_AMBULATORY_CARE_PROVIDER_SITE_OTHER): Payer: Self-pay

## 2019-05-03 ENCOUNTER — Encounter: Payer: Self-pay | Admitting: Rheumatology

## 2019-05-03 ENCOUNTER — Ambulatory Visit (INDEPENDENT_AMBULATORY_CARE_PROVIDER_SITE_OTHER): Payer: Medicare Other | Admitting: Rheumatology

## 2019-05-03 ENCOUNTER — Other Ambulatory Visit: Payer: Self-pay

## 2019-05-03 VITALS — BP 138/93 | HR 67 | Resp 14 | Ht 65.0 in | Wt 177.0 lb

## 2019-05-03 DIAGNOSIS — Z79899 Other long term (current) drug therapy: Secondary | ICD-10-CM | POA: Diagnosis not present

## 2019-05-03 DIAGNOSIS — M0609 Rheumatoid arthritis without rheumatoid factor, multiple sites: Secondary | ICD-10-CM

## 2019-05-03 DIAGNOSIS — Z8639 Personal history of other endocrine, nutritional and metabolic disease: Secondary | ICD-10-CM

## 2019-05-03 DIAGNOSIS — F40298 Other specified phobia: Secondary | ICD-10-CM

## 2019-05-03 DIAGNOSIS — M19071 Primary osteoarthritis, right ankle and foot: Secondary | ICD-10-CM

## 2019-05-03 DIAGNOSIS — M19072 Primary osteoarthritis, left ankle and foot: Secondary | ICD-10-CM

## 2019-05-03 DIAGNOSIS — M19042 Primary osteoarthritis, left hand: Secondary | ICD-10-CM

## 2019-05-03 DIAGNOSIS — Z9889 Other specified postprocedural states: Secondary | ICD-10-CM

## 2019-05-03 DIAGNOSIS — Z87891 Personal history of nicotine dependence: Secondary | ICD-10-CM

## 2019-05-03 DIAGNOSIS — M19041 Primary osteoarthritis, right hand: Secondary | ICD-10-CM

## 2019-05-03 DIAGNOSIS — Z87448 Personal history of other diseases of urinary system: Secondary | ICD-10-CM

## 2019-05-03 NOTE — Patient Instructions (Signed)
Standing Labs We placed an order today for your standing lab work.    Please come back and get your standing labs in November   We have open lab daily Monday through Thursday from 8:30-12:30 PM and 1:30-4:30 PM and Friday from 8:30-12:30 PM and 1:30 -4:00 PM at the office of Dr. Leonard Hendler.   You may experience shorter wait times on Monday and Friday afternoons. The office is located at [address], [address], [place], [place] [address] No appointment is necessary.   Labs are drawn by [facility].  You may receive a bill from [facility] for your lab work.  If you wish to have your labs drawn at another location, please call the office 24 hours in advance to send orders.  If you have any questions regarding directions or hours of operation,  please call [phone].   Just as a reminder please drink plenty of water prior to coming for your lab work. Thanks!  

## 2019-05-04 LAB — COMPLETE METABOLIC PANEL WITH GFR
AG Ratio: 1.5 (calc) (ref 1.0–2.5)
ALT: 23 U/L (ref 9–46)
AST: 41 U/L — ABNORMAL HIGH (ref 10–35)
Albumin: 4.8 g/dL (ref 3.6–5.1)
Alkaline phosphatase (APISO): 86 U/L (ref 35–144)
BUN/Creatinine Ratio: 12 (calc) (ref 6–22)
BUN: 15 mg/dL (ref 7–25)
CO2: 27 mmol/L (ref 20–32)
Calcium: 9.8 mg/dL (ref 8.6–10.3)
Chloride: 103 mmol/L (ref 98–110)
Creat: 1.21 mg/dL — ABNORMAL HIGH (ref 0.70–1.18)
GFR, Est African American: 67 mL/min/{1.73_m2} (ref >=60)
GFR, Est Non African American: 57 mL/min/{1.73_m2} — ABNORMAL LOW (ref >=60)
Globulin: 3.1 g/dL (calc) (ref 1.9–3.7)
Glucose, Bld: 90 mg/dL (ref 65–99)
Potassium: 4.5 mmol/L (ref 3.5–5.3)
Sodium: 139 mmol/L (ref 135–146)
Total Bilirubin: 2.7 mg/dL — ABNORMAL HIGH (ref 0.2–1.2)
Total Protein: 7.9 g/dL (ref 6.1–8.1)

## 2019-05-04 LAB — CBC WITH DIFFERENTIAL/PLATELET
Absolute Monocytes: 809 cells/uL (ref 200–950)
Basophils Absolute: 100 cells/uL (ref 0–200)
Basophils Relative: 1.3 %
Eosinophils Absolute: 200 cells/uL (ref 15–500)
Eosinophils Relative: 2.6 %
HCT: 44.2 % (ref 38.5–50.0)
Hemoglobin: 14.9 g/dL (ref 13.2–17.1)
Lymphs Abs: 1525 cells/uL (ref 850–3900)
MCH: 32.7 pg (ref 27.0–33.0)
MCHC: 33.7 g/dL (ref 32.0–36.0)
MCV: 97.1 fL (ref 80.0–100.0)
MPV: 10.7 fL (ref 7.5–12.5)
Monocytes Relative: 10.5 %
Neutro Abs: 5067 cells/uL (ref 1500–7800)
Neutrophils Relative %: 65.8 %
Platelets: 212 10*3/uL (ref 140–400)
RBC: 4.55 10*6/uL (ref 4.20–5.80)
RDW: 12.6 % (ref 11.0–15.0)
Total Lymphocyte: 19.8 %
WBC: 7.7 10*3/uL (ref 3.8–10.8)

## 2019-05-04 NOTE — Progress Notes (Signed)
Patient was doing well.  His creatinine and LFTs are mildly elevated.  Please advise him to reduce methotrexate to 3 tablets/week.

## 2019-06-11 ENCOUNTER — Other Ambulatory Visit: Payer: Self-pay | Admitting: Rheumatology

## 2019-06-11 NOTE — Telephone Encounter (Signed)
Last Visit: 05/03/19  Next Visit: due Jan 2021.  Labs: 05/03/19 creatinine and LFTs are mildly elevated.   Okay to refill per Dr. Estanislado Pandy

## 2019-06-11 NOTE — Telephone Encounter (Signed)
Please schedule patient for a follow up visit. Patient due January 2021. Thanks! 

## 2019-06-13 NOTE — Telephone Encounter (Signed)
[facility] for patient to call and schedule follow-up appointment due in January 2021.

## 2019-08-15 ENCOUNTER — Other Ambulatory Visit: Payer: Self-pay | Admitting: Rheumatology

## 2019-08-15 NOTE — Telephone Encounter (Signed)
Please schedule patient for a follow up visit. Patient due January 2021. Thanks! 

## 2019-08-15 NOTE — Telephone Encounter (Signed)
Last Visit: 05/03/19  Next Visit: due Jan 2021. Message sent to the front to schedule.   Okay to refill per Dr. Estanislado Pandy

## 2019-08-15 NOTE — Telephone Encounter (Signed)
[facility] for patient to call and schedule follow-up appointment.   °

## 2019-09-19 ENCOUNTER — Other Ambulatory Visit: Payer: Self-pay | Admitting: Rheumatology

## 2019-09-19 NOTE — Telephone Encounter (Signed)
Last Visit: 05/03/2019 Next Visit:  Labs: 05/03/2019 His creatinine and LFTs are mildly elevated. Please advise him to reduce methotrexate to 3 tablets/week.  Attempted to contact patient and unable to reach patient or leave a message. Patient needs labs and a follow up appointment.

## 2019-10-02 ENCOUNTER — Telehealth: Payer: Self-pay | Admitting: Rheumatology

## 2019-10-02 NOTE — Telephone Encounter (Signed)
Patient called requesting prescription refill of Methotrexate (90 day supply) to be sent to [facility].

## 2019-10-03 NOTE — Telephone Encounter (Signed)
Attempted to contact patient and no answer/voicemail available.   Patient is due to update labs prior to refill.  Labs: 05/03/2019 His creatinine and LFTs are mildly elevated. Please advise him to reduce methotrexate to 3 tablets/week.

## 2019-10-24 ENCOUNTER — Telehealth: Payer: Self-pay | Admitting: Physician Assistant

## 2019-10-24 NOTE — Telephone Encounter (Signed)
Attempted to call patient to schedule fu visit per pcp. LVM.  Patient needs a fu with pcp.

## 2019-11-22 NOTE — Progress Notes (Signed)
Office Visit Note  Patient: Larry Salazar             Date of Birth: 08/21/1941           MRN: [id number]             PCP: Rich Fuchs, PA Referring: Rich Fuchs, PA Visit Date: 11/28/2019 Occupation: @GUAROCC @  Subjective:  Rheumatoid Arthritis (Doing great)   History of Present Illness: Larry Salazar is a 79 y.o. male with history of seronegative rheumatoid arthritis and osteoarthritis.  He decided to come off methotrexate about couple of months ago.  He states he has been doing well without any joint swelling.  He has had some headache few days ago which lasted for about 3 days and it is resolved now he describes the headache was mostly in the left frontal region.  Activities of Daily Living:  Patient reports morning stiffness for 0 none.   Patient Denies nocturnal pain.  Difficulty dressing/grooming: Denies Difficulty climbing stairs: Denies Difficulty getting out of chair: Denies Difficulty using hands for taps, buttons, cutlery, and/or writing: Denies  Review of Systems  Constitutional: Negative for fatigue and night sweats.  HENT: Negative for mouth sores, mouth dryness and nose dryness.   Eyes: Negative for redness and dryness.  Respiratory: Negative for shortness of breath and difficulty breathing.   Cardiovascular: Negative for chest pain, palpitations, hypertension, irregular heartbeat and swelling in legs/feet.  Gastrointestinal: Negative for constipation and diarrhea.  Endocrine: Positive for excessive thirst. Negative for increased urination.  Genitourinary: Negative for difficulty urinating.  Musculoskeletal: Positive for gait problem. Negative for arthralgias, joint pain, joint swelling, myalgias, muscle weakness, morning stiffness, muscle tenderness and myalgias.  Skin: Negative for color change, rash, hair loss, nodules/bumps, skin tightness, ulcers and sensitivity to sunlight.  Allergic/Immunologic: Negative for susceptible to infections.    Neurological: Negative for dizziness, fainting, headaches, memory loss, night sweats and weakness ( ).  Hematological: Negative for bruising/bleeding tendency and swollen glands.  Psychiatric/Behavioral: Negative for depressed mood and sleep disturbance. The patient is not nervous/anxious.     PMFS History:  Patient Active Problem List   Diagnosis Date Noted  . Rheumatoid arthritis, seronegative, multiple sites ([place]) 05/05/2017  . High risk medications  long-term use 05/05/2017  . Needle phobia 05/05/2017  . History of renal insufficiency 05/05/2017  . Primary osteoarthritis of both hands 05/05/2017  . Primary osteoarthritis of both feet 05/05/2017  . History of biceps tendon repair  05/05/2017  . History of hypothyroidism 05/05/2017  . History of hyperlipidemia 05/05/2017  . Former smoker Quit 2017  05/05/2017    Past Medical History:  Diagnosis Date  . High cholesterol   . Hypothyroidism   . Rheumatoid arthritis ([place])     Family History  Problem Relation Age of Onset  . Heart attack Father   . Hypertension Father   . Diabetes Brother   . Diabetes Sister   . Diabetes Sister   . Heart attack Son   . Healthy Son   . Healthy Daughter    Past Surgical History:  Procedure Laterality Date  . SHOULDER SURGERY Right    Social History   Social History Narrative  . Not on file    There is no immunization history on file for this patient.   Objective: Vital Signs: BP 135/73 (BP Location: Left Arm, Patient Position: Sitting, Cuff Size: Normal)   Pulse 70   Resp 18   Ht 5\' 4"  (1.626 m)   Wt 183  lb 6.4 oz (83.2 kg)   BMI 31.48 kg/m    Physical Exam Vitals and nursing note reviewed.  Constitutional:      Appearance: He is well-developed.  HENT:     Head: Normocephalic and atraumatic.  Eyes:     Conjunctiva/sclera: Conjunctivae normal.     Pupils: Pupils are equal, round, and reactive to light.  Cardiovascular:     Rate and Rhythm: Normal rate and regular rhythm.      Heart sounds: Normal heart sounds.  Pulmonary:     Effort: Pulmonary effort is normal.     Breath sounds: Normal breath sounds.  Abdominal:     General: Bowel sounds are normal.     Palpations: Abdomen is soft.  Musculoskeletal:     Cervical back: Normal range of motion and neck supple.  Skin:    General: Skin is warm and dry.     Capillary Refill: Capillary refill takes less than 2 seconds.  Neurological:     Mental Status: He is alert and oriented to person, place, and time.  Psychiatric:        Behavior: Behavior normal.      Musculoskeletal Exam: C-spine was in good range of motion.  Shoulder joints elbow joints wrist joints in good range of motion.  He has DIP and PIP thickening with no synovitis.  Hip joints knee joints ankles MTPs PIPs with good range of motion with no synovitis.  CDAI Exam: CDAI Score: 0  Patient Global: 0 mm; Provider Global: 0 mm Swollen: 0 ; Tender: 0  Joint Exam 11/28/2019   No joint exam has been documented for this visit   There is currently no information documented on the homunculus. Go to the Rheumatology activity and complete the homunculus joint exam.  Investigation: No additional findings.  Imaging: No results found.  Recent Labs: Lab Results  Component Value Date   WBC 7.7 05/03/2019   HGB 14.9 05/03/2019   PLT 212 05/03/2019   NA 139 05/03/2019   K 4.5 05/03/2019   CL 103 05/03/2019   CO2 27 05/03/2019   GLUCOSE 90 05/03/2019   BUN 15 05/03/2019   CREATININE 1.21 (H) 05/03/2019   BILITOT 2.7 (H) 05/03/2019   ALKPHOS 72 04/29/2017   AST 41 (H) 05/03/2019   ALT 23 05/03/2019   PROT 7.9 05/03/2019   ALBUMIN 4.1 04/29/2017   CALCIUM 9.8 05/03/2019   GFRAA 67 05/03/2019    Speciality Comments: No specialty comments available.  Procedures:  No procedures performed Allergies: Patient has no known allergies.   Assessment / Plan:     Visit Diagnoses: Rheumatoid arthritis, seronegative, multiple sites (H[facility])-patient  has been off methotrexate for the last 2 months.  He has been doing well.  He tried stopping methotrexate few years back and had recurrence of arthritis 7 months later.  We will observe for right now.  Have advised him to contact me in case he has recurrence of symptoms.  He states he is moving to [place] with his wife due to family reasons.  If he is here in 6 months he will come for follow-up visit.  Otherwise he will establish with a rheumatologist in [place].  High risk medications  long-term use -he discontinued methotrexate 2 months ago.  Primary osteoarthritis of both hands-he has some DIP and PIP thickening without much discomfort.  Primary osteoarthritis of both feet-currently not having much discomfort.  History of biceps tendon repair -doing well.  Needle phobia  History of  hyperlipidemia  History of renal insufficiency  History of hypothyroidism  Former smoker Quit 2017   Orders: No orders of the defined types were placed in this encounter.  No orders of the defined types were placed in this encounter.    Follow-Up Instructions: Return in about 6 months (around 05/30/2020) for Rheumatoid arthritis, Osteoarthritis.   Pollyann Savoy, MD  Note - This record has been created using Animal nutritionist.  Chart creation errors have been sought, but may not always  have been located. Such creation errors do not reflect on  the standard of medical care.

## 2019-11-28 ENCOUNTER — Ambulatory Visit: Payer: Medicare Other | Admitting: Physician Assistant

## 2019-11-28 ENCOUNTER — Other Ambulatory Visit: Payer: Self-pay

## 2019-11-28 ENCOUNTER — Encounter: Payer: Self-pay | Admitting: Physician Assistant

## 2019-11-28 VITALS — BP 135/73 | HR 70 | Resp 18 | Ht 64.0 in | Wt 183.4 lb

## 2019-11-28 DIAGNOSIS — Z8639 Personal history of other endocrine, nutritional and metabolic disease: Secondary | ICD-10-CM

## 2019-11-28 DIAGNOSIS — Z79899 Other long term (current) drug therapy: Secondary | ICD-10-CM

## 2019-11-28 DIAGNOSIS — M19072 Primary osteoarthritis, left ankle and foot: Secondary | ICD-10-CM

## 2019-11-28 DIAGNOSIS — M19071 Primary osteoarthritis, right ankle and foot: Secondary | ICD-10-CM | POA: Diagnosis not present

## 2019-11-28 DIAGNOSIS — Z9889 Other specified postprocedural states: Secondary | ICD-10-CM

## 2019-11-28 DIAGNOSIS — M19041 Primary osteoarthritis, right hand: Secondary | ICD-10-CM

## 2019-11-28 DIAGNOSIS — F40298 Other specified phobia: Secondary | ICD-10-CM

## 2019-11-28 DIAGNOSIS — Z87448 Personal history of other diseases of urinary system: Secondary | ICD-10-CM

## 2019-11-28 DIAGNOSIS — M0609 Rheumatoid arthritis without rheumatoid factor, multiple sites: Secondary | ICD-10-CM

## 2019-11-28 DIAGNOSIS — Z87891 Personal history of nicotine dependence: Secondary | ICD-10-CM

## 2019-11-28 DIAGNOSIS — M19042 Primary osteoarthritis, left hand: Secondary | ICD-10-CM

## 2020-01-14 NOTE — Progress Notes (Signed)
Office Visit Note  Patient: Larry Salazar             Date of Birth: 1941/01/16           MRN: [id number]             PCP: Heron Nay, PA Referring: Heron Nay, PA Visit Date: 01/15/2020 Occupation: @GUAROCC @  Subjective:  Pain and swelling in both hands  History of Present Illness: Larry Salazar is a 79 y.o. male with history of seronegative rheumatoid arthritis and osteoarthritis.  He stopped taking methotrexate in January 2021.  He states he did fine for a few months but now the pain and swelling has returned in his bilateral hands about 3 weeks ago.  He is having difficulty making a fist.  He has some discomfort in his left shoulder.  Activities of Daily Living:  Patient reports joint stiffness all day  Patient Denies nocturnal pain.  Difficulty dressing/grooming: Reports Difficulty climbing stairs: Denies Difficulty getting out of chair: Denies Difficulty using hands for taps, buttons, cutlery, and/or writing: Reports  Review of Systems  Constitutional: Positive for fatigue. Negative for night sweats.  HENT: Negative for mouth sores, mouth dryness and nose dryness.   Eyes: Negative for redness, itching and dryness.  Respiratory: Negative for shortness of breath and difficulty breathing.   Cardiovascular: Negative for chest pain, palpitations, hypertension, irregular heartbeat and swelling in legs/feet.  Gastrointestinal: Negative for constipation and diarrhea.  Endocrine: Negative for increased urination.  Genitourinary: Negative for difficulty urinating and painful urination.  Musculoskeletal: Positive for arthralgias, joint pain, joint swelling and morning stiffness. Negative for myalgias, muscle weakness, muscle tenderness and myalgias.  Skin: Negative for color change, rash, hair loss, nodules/bumps, skin tightness, ulcers and sensitivity to sunlight.  Allergic/Immunologic: Negative for susceptible to infections.  Neurological: Negative for dizziness,  fainting, numbness, memory loss, night sweats and weakness.  Hematological: Positive for bruising/bleeding tendency. Negative for swollen glands.  Psychiatric/Behavioral: Negative for depressed mood and sleep disturbance. The patient is not nervous/anxious.     PMFS History:  Patient Active Problem List   Diagnosis Date Noted  . Rheumatoid arthritis, seronegative, multiple sites ([facility]) 05/05/2017  . High risk medications  long-term use 05/05/2017  . Needle phobia 05/05/2017  . History of renal insufficiency 05/05/2017  . Primary osteoarthritis of both hands 05/05/2017  . Primary osteoarthritis of both feet 05/05/2017  . History of biceps tendon repair  05/05/2017  . History of hypothyroidism 05/05/2017  . History of hyperlipidemia 05/05/2017  . Former smoker Quit 2017  05/05/2017    Past Medical History:  Diagnosis Date  . High cholesterol   . Hypothyroidism   . Rheumatoid arthritis ([facility])     Family History  Problem Relation Age of Onset  . Heart attack Father   . Hypertension Father   . Diabetes Brother   . Diabetes Sister   . Diabetes Sister   . Heart attack Son   . Healthy Son   . Healthy Daughter    Past Surgical History:  Procedure Laterality Date  . SHOULDER SURGERY Right    Social History   Social History Narrative  . Not on file    There is no immunization history on file for this patient.   Objective: Vital Signs: BP (!) 143/87 (BP Location: Left Arm, Patient Position: Sitting, Cuff Size: Normal)   Pulse 64   Resp 18   Ht 5\' 5"  (1.651 m)   Wt 183 lb (83 kg)   BMI  30.45 kg/m    Physical Exam Vitals and nursing note reviewed.  Constitutional:      Appearance: He is well-developed.  HENT:     Head: Normocephalic and atraumatic.  Eyes:     Conjunctiva/sclera: Conjunctivae normal.     Pupils: Pupils are equal, round, and reactive to light.  Pulmonary:     Effort: Pulmonary effort is normal.  Abdominal:     General: Bowel sounds are normal.      Palpations: Abdomen is soft.  Musculoskeletal:     Cervical back: Normal range of motion and neck supple.  Skin:    General: Skin is warm and dry.     Capillary Refill: Capillary refill takes less than 2 seconds.  Neurological:     Mental Status: He is alert and oriented to person, place, and time.  Psychiatric:        Behavior: Behavior normal.      Musculoskeletal Exam: C-spine was in good range of motion.  He has discomfort range of motion of his left shoulder joint.  Elbow joints with good range of motion.  He has synovitis over MCPs and PIPs as described below.  Hip joints, knee joints were in good range of motion.  He had no tenderness across MTPs.  CDAI Exam: CDAI Score: 10.1  Patient Global: 5 mm; Provider Global: 6 mm Swollen: 4 ; Tender: 5  Joint Exam 01/15/2020      Right  Left  Glenohumeral      Tender  MCP 2  Swollen Tender     MCP 3  Swollen Tender  Swollen Tender  PIP 5  Swollen Tender        Investigation: No additional findings.  Imaging: No results found.  Recent Labs: Lab Results  Component Value Date   WBC 7.7 05/03/2019   HGB 14.9 05/03/2019   PLT 212 05/03/2019   NA 139 05/03/2019   K 4.5 05/03/2019   CL 103 05/03/2019   CO2 27 05/03/2019   GLUCOSE 90 05/03/2019   BUN 15 05/03/2019   CREATININE 1.21 (H) 05/03/2019   BILITOT 2.7 (H) 05/03/2019   ALKPHOS 72 04/29/2017   AST 41 (H) 05/03/2019   ALT 23 05/03/2019   PROT 7.9 05/03/2019   ALBUMIN 4.1 04/29/2017   CALCIUM 9.8 05/03/2019   GFRAA 67 05/03/2019    Speciality Comments: No specialty comments available.  Procedures:  No procedures performed Allergies: Patient has no known allergies.   Assessment / Plan:     Visit Diagnoses: Rheumatoid arthritis, seronegative, multiple sites [facility]) -patient was in remission and stopped his methotrexate himself in January.  He developed a flare of arthritis about 3 weeks ago.  He had synovitis in multiple joints and having difficulty making a  fist.  We had detailed discussion regarding going back on methotrexate.  He has done well on 3 tablets of methotrexate in the past.  He has mild elevation of creatinine and LFTs which has been stable over the years.  I will check following labs before starting methotrexate today.  The plan is to start him on methotrexate 3 tablets p.o. weekly along with folic acid 1 mg p.o. daily after the labs are available.  He will need lab monitoring in 2 weeks, 1 month and then every 3 months.  He is also moving to [place].  Have advised him to establish with a rheumatologist there as soon as possible for monitoring labs and future refills.- Plan: Sedimentation rate, Rheumatoid factor, Cyclic citrul  peptide antibody, IgG, 14-3-3 eta Protein  High risk medications  long-term use - he discontinued methotrexate 2 months ago. - Plan: CBC with Differential/Platelet, COMPLETE METABOLIC PANEL WITH GFR, CBC with Differential/Platelet, COMPLETE METABOLIC PANEL WITH GFR  Primary osteoarthritis of both hands-he has some stiffness in his hands.  Primary osteoarthritis of both feet-he has osteoarthritic changes in his feet but not any synovitis.   History of biceps tendon repair   Needle phobia  History of hypothyroidism  History of renal insufficiency  History of hyperlipidemia  Former smoker Quit 2017   Orders: Orders Placed This Encounter  Procedures  . CBC with Differential/Platelet  . COMPLETE METABOLIC PANEL WITH GFR  . Sedimentation rate  . Rheumatoid factor  . Cyclic citrul peptide antibody, IgG  . 14-3-3 eta Protein  . CBC with Differential/Platelet  . COMPLETE METABOLIC PANEL WITH GFR  . Hepatitis B core antibody, IgM  . Hepatitis B surface antigen  . Hepatitis C antibody  . HIV Antibody (routine testing w rflx)  . QuantiFERON-TB Gold Plus  . Serum protein electrophoresis with reflex  . IgG, IgA, IgM   Meds ordered this encounter  Medications  . predniSONE (DELTASONE) 5 MG tablet    Sig:  Take 4 tablets by mouth daily x4 days, 3 tablets by mouth daily x4 days, 2 tablets by mouth daily x4 days, 1 tablet by mouth daily x4 days.    Dispense:  40 tablet    Refill:  0    Face-to-face time spent with patient was 30 minutes. Greater than 50% of time was spent in counseling and coordination of care.  Follow-Up Instructions: Return in about 5 months (around 06/16/2020) for Rheumatoid arthritis.   Pollyann Savoy, MD  Note - This record has been created using Animal nutritionist.  Chart creation errors have been sought, but may not always  have been located. Such creation errors do not reflect on  the standard of medical care.

## 2020-01-15 ENCOUNTER — Ambulatory Visit: Payer: Medicare Other | Admitting: Rheumatology

## 2020-01-15 ENCOUNTER — Encounter: Payer: Self-pay | Admitting: Physician Assistant

## 2020-01-15 ENCOUNTER — Other Ambulatory Visit: Payer: Self-pay

## 2020-01-15 VITALS — BP 143/87 | HR 64 | Resp 18 | Ht 65.0 in | Wt 183.0 lb

## 2020-01-15 DIAGNOSIS — Z87891 Personal history of nicotine dependence: Secondary | ICD-10-CM

## 2020-01-15 DIAGNOSIS — Z9889 Other specified postprocedural states: Secondary | ICD-10-CM

## 2020-01-15 DIAGNOSIS — M0609 Rheumatoid arthritis without rheumatoid factor, multiple sites: Secondary | ICD-10-CM | POA: Diagnosis not present

## 2020-01-15 DIAGNOSIS — Z79899 Other long term (current) drug therapy: Secondary | ICD-10-CM

## 2020-01-15 DIAGNOSIS — M19041 Primary osteoarthritis, right hand: Secondary | ICD-10-CM

## 2020-01-15 DIAGNOSIS — M19042 Primary osteoarthritis, left hand: Secondary | ICD-10-CM

## 2020-01-15 DIAGNOSIS — M19072 Primary osteoarthritis, left ankle and foot: Secondary | ICD-10-CM

## 2020-01-15 DIAGNOSIS — Z87448 Personal history of other diseases of urinary system: Secondary | ICD-10-CM

## 2020-01-15 DIAGNOSIS — M19071 Primary osteoarthritis, right ankle and foot: Secondary | ICD-10-CM

## 2020-01-15 DIAGNOSIS — F40298 Other specified phobia: Secondary | ICD-10-CM

## 2020-01-15 DIAGNOSIS — Z8639 Personal history of other endocrine, nutritional and metabolic disease: Secondary | ICD-10-CM

## 2020-01-15 LAB — CBC WITH DIFFERENTIAL/PLATELET
Basophils Relative: 0.9 %
WBC: 7.9 10*3/uL (ref 3.8–10.8)

## 2020-01-15 LAB — COMPLETE METABOLIC PANEL WITH GFR: ALT: 14 U/L (ref 9–46)

## 2020-01-15 MED ORDER — PREDNISONE 5 MG PO TABS: ORAL_TABLET | ORAL | 0 refills | Status: DC

## 2020-01-15 NOTE — Progress Notes (Signed)
Pharmacy Note  Subjective: Patient presents today to [facility] for follow up office visit. Patient seen by the pharmacist for counseling on methotrexate for rheumatoid arthritis. He has been on MTX in the past.  He discontinued MTX on his own in January and now has a flare.  Objective: CBC    Component Value Date/Time   WBC 7.7 05/03/2019 1440   RBC 4.55 05/03/2019 1440   HGB 14.9 05/03/2019 1440   HCT 44.2 05/03/2019 1440   PLT 212 05/03/2019 1440   MCV 97.1 05/03/2019 1440   MCH 32.7 05/03/2019 1440   MCHC 33.7 05/03/2019 1440   RDW 12.6 05/03/2019 1440   LYMPHSABS 1,525 05/03/2019 1440   MONOABS 960 (H) 04/29/2017 0919   EOSABS 200 05/03/2019 1440   BASOSABS 100 05/03/2019 1440    CMP     Component Value Date/Time   NA 139 05/03/2019 1440   K 4.5 05/03/2019 1440   CL 103 05/03/2019 1440   CO2 27 05/03/2019 1440   GLUCOSE 90 05/03/2019 1440   BUN 15 05/03/2019 1440   CREATININE 1.21 (H) 05/03/2019 1440   CALCIUM 9.8 05/03/2019 1440   PROT 7.9 05/03/2019 1440   ALBUMIN 4.1 04/29/2017 0919   AST 41 (H) 05/03/2019 1440   ALT 23 05/03/2019 1440   ALKPHOS 72 04/29/2017 0919   BILITOT 2.7 (H) 05/03/2019 1440   GFRNONAA 57 (L) 05/03/2019 1440   GFRAA 67 05/03/2019 1440    Baseline Immunosuppressant Therapy Labs TB GOLD, hepatitis panel, HIV, SPEP, and Immunoglobulins update pending 01/15/20   Chest-xray:  No active cardiopulmonary disease 08/03/2006  Alcohol use: N/A  Assessment/Plan:   Patient was counseled on the purpose, proper use, and adverse effects of methotrexate including nausea, infection, and signs and symptoms of pneumonitis. Discussed that there is the possibility of an increased risk of malignancy, specifically lymphomas, but it is not well understood if this increased risk is due to the medication or the disease state.  Instructed patient that medication should be held for infection and prior to surgery.  Advised patient to avoid live  vaccines. Recommend annual influenza, Pneumovax 23, Prevnar 13, and Shingrix as indicated.   Reviewed instructions with patient to take methotrexate weekly along with folic acid daily.  Discussed the importance of frequent monitoring of kidney and liver function and blood counts, and provided patient with standing lab instructions.  Counseled patient to avoid NSAIDs and alcohol while on methotrexate.  Provided patient with educational materials on methotrexate and answered all questions.   Patient voiced understanding.  Patient consented to methotrexate use.  Will upload into chart.    Dose of methotrexate will be 3 tablets every 7 days along with folic acid 1 mg daily. Prescription pending lab results.  Prescription for prednisone taper given due to swelling and inflammation.    All questions encouraged and answered.  Instructed patient to call with any questions or concerns.  Verlin Fester, PharmD, [place], CPP Clinical Specialty Pharmacist [phone]  01/15/2020 11:27 AM

## 2020-01-15 NOTE — Patient Instructions (Signed)
Standing Labs We placed an order today for your standing lab work.    Please come back and get your standing labs in 2 weeks, 1 month and then every 3 months  We have open lab daily Monday through Thursday from 8:30-12:30 PM and 1:30-4:30 PM and Friday from 8:30-12:30 PM and 1:30-4:00 PM at the office of Dr. Pollyann Savoy.   You may experience shorter wait times on Monday and Friday afternoons. The office is located at [address], [address], [place], [place] [address] No appointment is necessary.   Labs are drawn by [facility].  You may receive a bill from [facility] for your lab work.  If you wish to have your labs drawn at another location, please call the office 24 hours in advance to send orders.  If you have any questions regarding directions or hours of operation,  please call [phone].   Just as a reminder please drink plenty of water prior to coming for your lab work. Thanks!

## 2020-01-16 ENCOUNTER — Other Ambulatory Visit: Payer: Self-pay | Admitting: Pharmacist

## 2020-01-16 ENCOUNTER — Other Ambulatory Visit: Payer: Self-pay

## 2020-01-16 ENCOUNTER — Telehealth: Payer: Self-pay | Admitting: Rheumatology

## 2020-01-16 DIAGNOSIS — M0609 Rheumatoid arthritis without rheumatoid factor, multiple sites: Secondary | ICD-10-CM

## 2020-01-16 MED ORDER — METHOTREXATE 2.5 MG PO TABS: 7.5000 mg | ORAL_TABLET | ORAL | 0 refills | Status: DC

## 2020-01-16 MED ORDER — FOLIC ACID 1 MG PO TABS: 1.0000 mg | ORAL_TABLET | Freq: Every day | ORAL | 1 refills | Status: DC

## 2020-01-16 NOTE — Telephone Encounter (Signed)
Patient left a voicemail stating he was returning a missed call from our office.

## 2020-01-16 NOTE — Progress Notes (Signed)
Labs resulted within normal limits.  Patient to resume MTX 3 tablets every 7 days.  Prescription sent to [facility] per patient request.   Verlin Fester, PharmD, [facility], CPP Clinical Specialty Pharmacist [phone]  01/16/2020 10:41 AM

## 2020-01-16 NOTE — Telephone Encounter (Signed)
Attempted to contact patient and left message on machine to advise patient to call the office.   Spoke with patient this morning in regards to methotrexate and folic acid refills.

## 2020-01-16 NOTE — Progress Notes (Signed)
Okay to call in methotrexate.  CBC and CMP are normal.  Sed rate is elevated due to the flare.

## 2020-01-18 ENCOUNTER — Other Ambulatory Visit: Payer: Self-pay

## 2020-01-18 ENCOUNTER — Telehealth: Payer: Self-pay | Admitting: Rheumatology

## 2020-01-18 DIAGNOSIS — Z79899 Other long term (current) drug therapy: Secondary | ICD-10-CM

## 2020-01-18 DIAGNOSIS — Z111 Encounter for screening for respiratory tuberculosis: Secondary | ICD-10-CM

## 2020-01-18 NOTE — Telephone Encounter (Signed)
Patient left a voicemail stating he was returning a missed call from our office.   Patient states if its about his medication he hasn't received it yet so his hands are still painful.

## 2020-01-18 NOTE — Telephone Encounter (Signed)
Spoke with patient and advised him that he needs repeat TB Gold prior to restarting methotrexate, patient verbalized understanding and will come by the office on Monday to have lab drawn. Advised patient to wait for Dr. Fatima Sanger clearance to resume methotrexate after the repeat lab.

## 2020-01-18 NOTE — Progress Notes (Signed)
Patient will need repeat TB Gold prior to restarting methotrexate.

## 2020-01-21 ENCOUNTER — Other Ambulatory Visit: Payer: Self-pay

## 2020-01-21 DIAGNOSIS — Z79899 Other long term (current) drug therapy: Secondary | ICD-10-CM

## 2020-01-21 DIAGNOSIS — Z111 Encounter for screening for respiratory tuberculosis: Secondary | ICD-10-CM

## 2020-01-21 LAB — 14-3-3 ETA PROTEIN: 14-3-3 eta Protein: 0.9 ng/mL — ABNORMAL HIGH (ref <0.2)

## 2020-01-21 LAB — COMPLETE METABOLIC PANEL WITH GFR
AG Ratio: 1.3 (calc) (ref 1.0–2.5)
AST: 35 U/L (ref 10–35)
Albumin: 4.3 g/dL (ref 3.6–5.1)
Alkaline phosphatase (APISO): 96 U/L (ref 35–144)
BUN: 15 mg/dL (ref 7–25)
CO2: 26 mmol/L (ref 20–32)
Calcium: 9.5 mg/dL (ref 8.6–10.3)
Chloride: 105 mmol/L (ref 98–110)
Creat: 1.07 mg/dL (ref 0.70–1.18)
GFR, Est African American: 77 mL/min/{1.73_m2} (ref >=60)
GFR, Est Non African American: 67 mL/min/{1.73_m2} (ref >=60)
Globulin: 3.2 g/dL (calc) (ref 1.9–3.7)
Glucose, Bld: 96 mg/dL (ref 65–99)
Potassium: 4 mmol/L (ref 3.5–5.3)
Sodium: 140 mmol/L (ref 135–146)
Total Bilirubin: 1.3 mg/dL — ABNORMAL HIGH (ref 0.2–1.2)
Total Protein: 7.5 g/dL (ref 6.1–8.1)

## 2020-01-21 LAB — QUANTIFERON-TB GOLD PLUS
Mitogen-NIL: 0 IU/mL
NIL: 0.02 IU/mL
QuantiFERON-TB Gold Plus: UNDETERMINED — AB
TB1-NIL: 0 IU/mL
TB2-NIL: 0 IU/mL

## 2020-01-21 LAB — CBC WITH DIFFERENTIAL/PLATELET
Absolute Monocytes: 798 cells/uL (ref 200–950)
Basophils Absolute: 71 cells/uL (ref 0–200)
Eosinophils Absolute: 142 cells/uL (ref 15–500)
Eosinophils Relative: 1.8 %
HCT: 44.2 % (ref 38.5–50.0)
Hemoglobin: 14.6 g/dL (ref 13.2–17.1)
Lymphs Abs: 1217 cells/uL (ref 850–3900)
MCH: 32.4 pg (ref 27.0–33.0)
MCHC: 33 g/dL (ref 32.0–36.0)
MCV: 98 fL (ref 80.0–100.0)
MPV: 10.8 fL (ref 7.5–12.5)
Monocytes Relative: 10.1 %
Neutro Abs: 5672 cells/uL (ref 1500–7800)
Neutrophils Relative %: 71.8 %
Platelets: 204 10*3/uL (ref 140–400)
RBC: 4.51 10*6/uL (ref 4.20–5.80)
RDW: 11.3 % (ref 11.0–15.0)
Total Lymphocyte: 15.4 %

## 2020-01-21 LAB — PROTEIN ELECTROPHORESIS, SERUM, WITH REFLEX
Albumin ELP: 4.3 g/dL (ref 3.8–4.8)
Alpha 1: 0.3 g/dL (ref 0.2–0.3)
Alpha 2: 1 g/dL — ABNORMAL HIGH (ref 0.5–0.9)
Beta 2: 0.5 g/dL (ref 0.2–0.5)
Beta Globulin: 0.5 g/dL (ref 0.4–0.6)
Gamma Globulin: 1.3 g/dL (ref 0.8–1.7)
Total Protein: 7.8 g/dL (ref 6.1–8.1)

## 2020-01-21 LAB — IGG, IGA, IGM
IgG (Immunoglobin G), Serum: 1405 mg/dL (ref 600–1540)
IgM, Serum: 76 mg/dL (ref 50–300)
Immunoglobulin A: 415 mg/dL — ABNORMAL HIGH (ref 70–320)

## 2020-01-21 LAB — SEDIMENTATION RATE: Sed Rate: 28 mm/h — ABNORMAL HIGH (ref 0–20)

## 2020-01-21 LAB — HIV ANTIBODY (ROUTINE TESTING W REFLEX): HIV 1&2 Ab, 4th Generation: NONREACTIVE

## 2020-01-21 LAB — HEPATITIS B SURFACE ANTIGEN: Hepatitis B Surface Ag: NONREACTIVE

## 2020-01-21 LAB — HEPATITIS C ANTIBODY
Hepatitis C Ab: NONREACTIVE
SIGNAL TO CUT-OFF: 0.03 (ref <1.00)

## 2020-01-21 LAB — HEPATITIS B CORE ANTIBODY, IGM: Hep B C IgM: NONREACTIVE

## 2020-01-21 LAB — CYCLIC CITRUL PEPTIDE ANTIBODY, IGG: Cyclic Citrullin Peptide Ab: >250 UNITS — ABNORMAL HIGH

## 2020-01-21 LAB — RHEUMATOID FACTOR: Rheumatoid fact SerPl-aCnc: 324 IU/mL — ABNORMAL HIGH (ref <14)

## 2020-01-21 NOTE — Progress Notes (Signed)
RF, anti-CCP and 14-3-3 eta all 3 antibodies are positive for rheumatoid arthritis.  His sed rate is elevated also.  We are waiting for repeat TB Gold prior to starting methotrexate.

## 2020-01-23 ENCOUNTER — Telehealth: Payer: Self-pay | Admitting: Rheumatology

## 2020-01-23 LAB — QUANTIFERON-TB GOLD PLUS
Mitogen-NIL: 0.01 IU/mL
NIL: 0.01 IU/mL
QuantiFERON-TB Gold Plus: UNDETERMINED — AB
TB1-NIL: 0 IU/mL
TB2-NIL: 0 IU/mL

## 2020-01-23 NOTE — Telephone Encounter (Signed)
Patient called requesting a return call regarding his labwork results.   

## 2020-01-23 NOTE — Telephone Encounter (Signed)
Tb gold is still in progress. French Ana checked the status of the lab this morning and requested it be expedited. I advised patient we will call him as soon as we receive the results. I advised patient numerous times to NOT re-start methotrexate until we receive the results. Patient verbalized understanding.

## 2020-01-23 NOTE — Telephone Encounter (Signed)
Patient called back stating "he just wants to know when he can restart taking his Methotrexate and Folic Acid."

## 2020-01-23 NOTE — Progress Notes (Signed)
I called patient and discussed that his repeat TB gold was indeterminate.  He will need a PPD test.  He was advised to contact his PCP to have a PPD placement on Friday so the results can be read on Monday.  If his PPD is negative then he can start methotrexate on Monday.  Sherron Ales, PA-C contacted patient's PCP Michelle Nasuti, PA-C.  Patient will be seen in their office on Friday for PPD placement.

## 2020-01-31 ENCOUNTER — Telehealth: Payer: Self-pay | Admitting: Rheumatology

## 2020-01-31 NOTE — Telephone Encounter (Signed)
Patient advised we will need the results sent to our office as we have not received results. Patient to contact the doctor's office and have the results sent.

## 2020-01-31 NOTE — Telephone Encounter (Signed)
Patient left a voicemail stating "I had the TB test that Dr. Corliss Skains ordered and I passed.  I am asking if I can start taking my Methotrexate now?"  Please advise

## 2020-02-01 ENCOUNTER — Telehealth: Payer: Self-pay | Admitting: Rheumatology

## 2020-02-01 NOTE — Telephone Encounter (Signed)
Larry Salazar called requesting a return call to let her know if Larry Salazar's TB gold test results have been received.  Larry Salazar states Larry Salazar is in a lot of pain and really needs his medication.

## 2020-02-01 NOTE — Telephone Encounter (Signed)
Patient advised we have received the PPD results and he may resume taking his MTX.

## 2020-02-01 NOTE — Telephone Encounter (Signed)
Ok to resume MTX.

## 2020-02-01 NOTE — Telephone Encounter (Signed)
Received results from [facility]. PPD resutls are negative. Okay for patient to resume MTX?

## 2020-03-11 ENCOUNTER — Other Ambulatory Visit: Payer: Self-pay

## 2020-03-11 ENCOUNTER — Emergency Department (HOSPITAL_BASED_OUTPATIENT_CLINIC_OR_DEPARTMENT_OTHER): Payer: Medicare Other

## 2020-03-11 ENCOUNTER — Encounter (HOSPITAL_BASED_OUTPATIENT_CLINIC_OR_DEPARTMENT_OTHER): Payer: Self-pay

## 2020-03-11 ENCOUNTER — Inpatient Hospital Stay (HOSPITAL_BASED_OUTPATIENT_CLINIC_OR_DEPARTMENT_OTHER)
Admit: 2020-03-11 | Discharge: 2020-03-17 | DRG: 417 | Disposition: A | Discharge status: Home / Self Care | Payer: Medicare Other | Attending: Internal Medicine | Admitting: Internal Medicine

## 2020-03-11 DIAGNOSIS — Z79899 Other long term (current) drug therapy: Secondary | ICD-10-CM

## 2020-03-11 DIAGNOSIS — K3189 Other diseases of stomach and duodenum: Secondary | ICD-10-CM | POA: Diagnosis present

## 2020-03-11 DIAGNOSIS — R1011 Right upper quadrant pain: Secondary | ICD-10-CM

## 2020-03-11 DIAGNOSIS — E7800 Pure hypercholesterolemia, unspecified: Secondary | ICD-10-CM | POA: Diagnosis present

## 2020-03-11 DIAGNOSIS — E860 Dehydration: Secondary | ICD-10-CM | POA: Diagnosis present

## 2020-03-11 DIAGNOSIS — K760 Fatty (change of) liver, not elsewhere classified: Secondary | ICD-10-CM | POA: Diagnosis present

## 2020-03-11 DIAGNOSIS — K8067 Calculus of gallbladder and bile duct with acute and chronic cholecystitis with obstruction: Principal | ICD-10-CM | POA: Diagnosis present

## 2020-03-11 DIAGNOSIS — E039 Hypothyroidism, unspecified: Secondary | ICD-10-CM | POA: Diagnosis present

## 2020-03-11 DIAGNOSIS — Z87891 Personal history of nicotine dependence: Secondary | ICD-10-CM

## 2020-03-11 DIAGNOSIS — M0609 Rheumatoid arthritis without rheumatoid factor, multiple sites: Secondary | ICD-10-CM | POA: Diagnosis present

## 2020-03-11 DIAGNOSIS — K659 Peritonitis, unspecified: Secondary | ICD-10-CM | POA: Diagnosis present

## 2020-03-11 DIAGNOSIS — K810 Acute cholecystitis: Secondary | ICD-10-CM | POA: Diagnosis present

## 2020-03-11 DIAGNOSIS — Z20822 Contact with and (suspected) exposure to covid-19: Secondary | ICD-10-CM | POA: Diagnosis present

## 2020-03-11 DIAGNOSIS — K2270 Barrett's esophagus without dysplasia: Secondary | ICD-10-CM | POA: Diagnosis present

## 2020-03-11 DIAGNOSIS — K2970 Gastritis, unspecified, without bleeding: Secondary | ICD-10-CM | POA: Diagnosis present

## 2020-03-11 DIAGNOSIS — N1831 Chronic kidney disease, stage 3a: Secondary | ICD-10-CM | POA: Diagnosis present

## 2020-03-11 DIAGNOSIS — E876 Hypokalemia: Secondary | ICD-10-CM | POA: Diagnosis present

## 2020-03-11 DIAGNOSIS — I714 Abdominal aortic aneurysm, without rupture: Secondary | ICD-10-CM | POA: Diagnosis present

## 2020-03-11 DIAGNOSIS — Z8639 Personal history of other endocrine, nutritional and metabolic disease: Secondary | ICD-10-CM

## 2020-03-11 DIAGNOSIS — R933 Abnormal findings on diagnostic imaging of other parts of digestive tract: Secondary | ICD-10-CM

## 2020-03-11 DIAGNOSIS — K8020 Calculus of gallbladder without cholecystitis without obstruction: Secondary | ICD-10-CM

## 2020-03-11 DIAGNOSIS — K819 Cholecystitis, unspecified: Secondary | ICD-10-CM

## 2020-03-11 DIAGNOSIS — N179 Acute kidney failure, unspecified: Secondary | ICD-10-CM | POA: Diagnosis present

## 2020-03-11 DIAGNOSIS — K5710 Diverticulosis of small intestine without perforation or abscess without bleeding: Secondary | ICD-10-CM | POA: Diagnosis present

## 2020-03-11 DIAGNOSIS — E785 Hyperlipidemia, unspecified: Secondary | ICD-10-CM | POA: Diagnosis present

## 2020-03-11 DIAGNOSIS — Z79890 Hormone replacement therapy (postmenopausal): Secondary | ICD-10-CM

## 2020-03-11 DIAGNOSIS — K859 Acute pancreatitis without necrosis or infection, unspecified: Secondary | ICD-10-CM | POA: Diagnosis not present

## 2020-03-11 DIAGNOSIS — R1013 Epigastric pain: Secondary | ICD-10-CM

## 2020-03-11 DIAGNOSIS — Z7982 Long term (current) use of aspirin: Secondary | ICD-10-CM

## 2020-03-11 DIAGNOSIS — K8050 Calculus of bile duct without cholangitis or cholecystitis without obstruction: Secondary | ICD-10-CM

## 2020-03-11 LAB — CBC WITH DIFFERENTIAL/PLATELET
Abs Immature Granulocytes: 0.05 10*3/uL (ref 0.00–0.07)
Basophils Absolute: 0.1 10*3/uL (ref 0.0–0.1)
Basophils Relative: 0 %
Eosinophils Absolute: 0 10*3/uL (ref 0.0–0.5)
Eosinophils Relative: 0 %
HCT: 42.1 % (ref 39.0–52.0)
Hemoglobin: 13.8 g/dL (ref 13.0–17.0)
Immature Granulocytes: 0 %
Lymphocytes Relative: 11 %
Lymphs Abs: 1.2 10*3/uL (ref 0.7–4.0)
MCH: 31.7 pg (ref 26.0–34.0)
MCHC: 32.8 g/dL (ref 30.0–36.0)
MCV: 96.8 fL (ref 80.0–100.0)
Monocytes Absolute: 1.9 10*3/uL — ABNORMAL HIGH (ref 0.1–1.0)
Monocytes Relative: 17 %
Neutro Abs: 8.2 10*3/uL — ABNORMAL HIGH (ref 1.7–7.7)
Neutrophils Relative %: 72 %
Platelets: 179 10*3/uL (ref 150–400)
RBC: 4.35 MIL/uL (ref 4.22–5.81)
RDW: 12.5 % (ref 11.5–15.5)
WBC: 11.5 10*3/uL — ABNORMAL HIGH (ref 4.0–10.5)
nRBC: 0 % (ref 0.0–0.2)

## 2020-03-11 LAB — URINALYSIS, ROUTINE W REFLEX MICROSCOPIC
Bilirubin Urine: NEGATIVE
Glucose, UA: NEGATIVE mg/dL
Ketones, ur: NEGATIVE mg/dL
Leukocytes,Ua: NEGATIVE
Nitrite: NEGATIVE
Protein, ur: NEGATIVE mg/dL
Specific Gravity, Urine: 1.025 (ref 1.005–1.030)
pH: 5.5 (ref 5.0–8.0)

## 2020-03-11 LAB — COMPREHENSIVE METABOLIC PANEL
ALT: 14 U/L (ref 0–44)
AST: 21 U/L (ref 15–41)
Albumin: 3.8 g/dL (ref 3.5–5.0)
Alkaline Phosphatase: 86 U/L (ref 38–126)
Anion gap: 10 (ref 5–15)
BUN: 19 mg/dL (ref 8–23)
CO2: 25 mmol/L (ref 22–32)
Calcium: 8.8 mg/dL — ABNORMAL LOW (ref 8.9–10.3)
Chloride: 100 mmol/L (ref 98–111)
Creatinine, Ser: 1.44 mg/dL — ABNORMAL HIGH (ref 0.61–1.24)
GFR calc Af Amer: 54 mL/min — ABNORMAL LOW (ref >60)
GFR calc non Af Amer: 46 mL/min — ABNORMAL LOW (ref >60)
Glucose, Bld: 119 mg/dL — ABNORMAL HIGH (ref 70–99)
Potassium: 3.8 mmol/L (ref 3.5–5.1)
Sodium: 135 mmol/L (ref 135–145)
Total Bilirubin: 2.7 mg/dL — ABNORMAL HIGH (ref 0.3–1.2)
Total Protein: 7.4 g/dL (ref 6.5–8.1)

## 2020-03-11 LAB — URINALYSIS, MICROSCOPIC (REFLEX)

## 2020-03-11 LAB — LIPASE, BLOOD: Lipase: 20 U/L (ref 11–51)

## 2020-03-11 LAB — TROPONIN I (HIGH SENSITIVITY): Troponin I (High Sensitivity): 5 ng/L (ref <18)

## 2020-03-11 MED ORDER — IOHEXOL 300 MG/ML  SOLN
80.0000 mL | Freq: Once | INTRAMUSCULAR | Status: AC | PRN
  Administered 2020-03-11: 80 mL via INTRAVENOUS

## 2020-03-11 MED ORDER — LACTATED RINGERS IV BOLUS
500.0000 mL | Freq: Once | INTRAVENOUS | Status: AC
  Administered 2020-03-12: 500 mL via INTRAVENOUS

## 2020-03-11 MED ORDER — PIPERACILLIN-TAZOBACTAM 3.375 G IVPB 30 MIN
3.3750 g | Freq: Once | INTRAVENOUS | Status: AC
  Administered 2020-03-11: 3.375 g via INTRAVENOUS
  Filled 2020-03-11 (×2): qty 50

## 2020-03-11 MED ORDER — SODIUM CHLORIDE 0.9 % IV BOLUS
500.0000 mL | Freq: Once | INTRAVENOUS | Status: AC
  Administered 2020-03-11: 500 mL via INTRAVENOUS

## 2020-03-11 NOTE — ED Triage Notes (Signed)
Pt states 2 days ago he fell. Pt reports difficulty walking due to generalized weakness an dizziness. Pt states his balance has been off. Pt's wife came into triage room and stating pt actually fell getting out of the car today. Pt had episode of confusion at that time. Pt does not remember this event. Wife also reports that pt has been having abd pain. Pt was evaluated at [facility] earlier today for these symptoms and the fall and was referred to cardiology. They are here for a second opinion.

## 2020-03-11 NOTE — ED Provider Notes (Signed)
[facility] EMERGENCY DEPARTMENT Provider Note   CSN: [id number] Arrival date & time: 03/11/20  1908     History Chief Complaint  Patient presents with  . Fall    Larry Salazar is a 79 y.o. male.  HPI Patient presents after lightheadedness and near syncope.  Around 2 days ago he developed abdominal pain and nausea after celebrating his birthday.  Since then has had decreased oral intake.  Decreased appetite.  States he is really not been eating or drinking much.  Pain is sharp and is mid upper abdomen.  States he is not feeling more lightheaded.  States when he stands up he feels if he could pass out.  Not feeling dizzy.  States that today he was getting out of the car began to feel lightheaded.  Fell down without head injury but did scrape his lower leg.  States he felt better with it.  Then went to his primary care doctor's office.  States they were worried about his heart and he has follow-up with cardiologist tomorrow.  No unexplained weight loss.  States during Covid he did put on some weight and has been losing little weight recently but has been trying to eat better.  No diarrhea.  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 High cholesterol   . Hypothyroidism   . Rheumatoid arthritis [facility])     Patient Active Problem List   Diagnosis Date Noted  . Rheumatoid arthritis, seronegative, multiple sites (HCC) 05/05/2017  . High risk medications  long-term use 05/05/2017  . Needle phobia 05/05/2017  . History of renal insufficiency 05/05/2017  . Primary osteoarthritis of both hands 05/05/2017  . Primary osteoarthritis of both feet 05/05/2017  . History of biceps tendon repair  05/05/2017  . History of hypothyroidism 05/05/2017  . History of hyperlipidemia 05/05/2017  . Former smoker Quit 2017  05/05/2017    Past Surgical History:  Procedure Laterality Date  .  SHOULDER SURGERY Right        Family History  Problem Relation Age of Onset  . Heart attack Father   . Hypertension Father   . Diabetes Brother   . Diabetes Sister   . Diabetes Sister   . Heart attack Son   . Healthy Son   . Healthy Daughter     Social History   Tobacco Use  . Smoking status: Former Smoker    Packs/day: 1.00    Years: 43.00    Pack years: 43.00    Types: Cigarettes    Quit date: 11/29/1998    Years since quitting: 21.2  . Smokeless tobacco: Never Used  Vaping Use  . Vaping Use: Never used  Substance Use Topics  . Alcohol use: Not Currently  . Drug use: Not Currently    Home Medications Prior to Admission medications   Medication Sig Start Date End Date Taking? Authorizing Provider  aspirin EC 81 MG tablet Take 81 mg by mouth daily. Swallow whole.   Yes [provider]  folic acid (FOLVITE) 1 MG tablet Take 1 tablet (1 mg total) by mouth daily. 01/16/20  Yes Deveshwar, Janalyn Rouse, MD  levothyroxine (SYNTHROID, LEVOTHROID) 75 MCG tablet Take by mouth. 06/30/18  Yes [provider]  methotrexate (RHEUMATREX) 2.5 MG tablet Take 3 tablets (7.5 mg total) by mouth once a week. Caution:Chemotherapy. Protect from light. 01/16/20  Yes Deveshwar, Janalyn Rouse, MD  simvastatin (  ZOCOR) 40 MG tablet Take by mouth. 06/30/18  Yes [provider]  predniSONE (DELTASONE) 5 MG tablet Take 4 tablets by mouth daily x4 days, 3 tablets by mouth daily x4 days, 2 tablets by mouth daily x4 days, 1 tablet by mouth daily x4 days. 01/15/20   Yopp, Amber C, RPH-CPP    Allergies    Patient has no known allergies.  Review of Systems   Review of Systems  Constitutional: Negative for fever and unexpected weight change.  Respiratory: Negative for shortness of breath.   Gastrointestinal: Positive for abdominal pain and nausea.  Genitourinary: Negative for flank pain.  Musculoskeletal: Negative for back pain.  Skin: Negative for rash.  Neurological: Positive for  light-headedness.  Psychiatric/Behavioral: Negative for confusion.  All other systems reviewed and are negative.   Physical Exam Updated Vital Signs BP (!) 128/108   Pulse 69   Temp 99 F (37.2 C) (Oral)   Resp (!) 21   Ht 5\' 5"  (1.651 m)   Wt 78 kg   SpO2 97%   BMI 28.62 kg/m   Physical Exam Vitals and nursing note reviewed.  HENT:     Head: Normocephalic.  Eyes:     Extraocular Movements: Extraocular movements intact.  Cardiovascular:     Rate and Rhythm: Regular rhythm.  Pulmonary:     Breath sounds: No wheezing, rhonchi or rales.  Abdominal:     Tenderness: There is abdominal tenderness.     Comments: Epigastric to right upper quadrant tenderness without rebound or guarding.  No hernias palpated.  Musculoskeletal:        General: No tenderness.     Cervical back: Neck supple.  Skin:    General: Skin is warm.     Capillary Refill: Capillary refill takes less than 2 seconds.  Neurological:     Mental Status: He is alert and oriented to person, place, and time.     ED Results / Procedures / Treatments   Labs (all labs ordered are listed, but only abnormal results are displayed) Labs Reviewed  COMPREHENSIVE METABOLIC PANEL - Abnormal; Notable for the following components:      Result Value   Glucose, Bld 119 (*)    Creatinine, Ser 1.44 (*)    Calcium 8.8 (*)    Total Bilirubin 2.7 (*)    GFR calc non Af Amer 46 (*)    GFR calc Af Amer 54 (*)    All other components within normal limits  CBC WITH DIFFERENTIAL/PLATELET - Abnormal; Notable for the following components:   WBC 11.5 (*)    Neutro Abs 8.2 (*)    Monocytes Absolute 1.9 (*)    All other components within normal limits  URINALYSIS, ROUTINE W REFLEX MICROSCOPIC - Abnormal; Notable for the following components:   Hgb urine dipstick TRACE (*)    All other components within normal limits  URINALYSIS, MICROSCOPIC (REFLEX) - Abnormal; Notable for the following components:   Bacteria, UA FEW (*)    All  other components within normal limits  SARS CORONAVIRUS 2 BY RT PCR (HOSPITAL ORDER, PERFORMED IN [facility])  LIPASE, BLOOD  TROPONIN I (HIGH SENSITIVITY)    EKG None  Radiology CT ABDOMEN PELVIS W CONTRAST  Result Date: 03/11/2020 CLINICAL DATA:  Right upper quadrant and epigastric pain EXAM: CT ABDOMEN AND PELVIS WITH CONTRAST TECHNIQUE: Multidetector CT imaging of the abdomen and pelvis was performed using the standard protocol following bolus administration of intravenous contrast. CONTRAST:  28mL OMNIPAQUE  IOHEXOL 300 MG/ML  SOLN COMPARISON:  Radiograph 03/11/2020 FINDINGS: Lower chest: No acute abnormality. Hepatobiliary: Subcentimeter hypodensity in the right hepatic lobe too small to further characterize. Dilated gallbladder with wall thickening and/or pericholecystic fluid. Mild soft tissue stranding in the right upper quadrant consistent with edema. Probable small calcified stones best seen on coronal views. Mildly enhancing ductal walls on the coronal views. Common bile duct upper normal at 7 mm. Possible common duct stone, series 2, image number 34, coronal series 5, image number 43. Pancreas: Unremarkable. No pancreatic ductal dilatation or surrounding inflammatory changes. Spleen: Normal in size without focal abnormality. Adrenals/Urinary Tract: Adrenal glands are normal. Cyst in the mid right kidney. No hydronephrosis. Bladder is normal Stomach/Bowel: Stomach is within normal limits. Appendix appears normal. Sigmoid colon diverticular disease without acute inflammatory change. No evidence of bowel wall thickening, distention, or inflammatory changes. Vascular/Lymphatic: Moderate aortic atherosclerosis. 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 No suspicious nodes Reproductive: Enlarged prostate Other: No free air or free fluid. Musculoskeletal: Degenerative changes of the spine. No acute or suspicious osseous abnormality IMPRESSION: 1.  Dilated gallbladder with suspected punctate stones. There is gallbladder wall thickening or pericholecystic fluid with soft tissue inflammatory process in the right upper quadrant, constellation of findings is concerning for an acute cholecystitis. Borderline dilatation of common bile duct with possible small filling defect or stone in the distal duct, correlate with LFTs. Follow-up MRCP as indicated. 2. 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 Recommend followup by ultrasound in 2 years. This recommendation follows ACR consensus guidelines: White Paper of the ACR Incidental Findings Committee II on Vascular Findings. J Am Coll Radiol 2013; 10:789-794. Aortic aneurysm NOS (ICD10-I71.9) 3. Sigmoid colon diverticular disease without acute inflammatory change. Electronically Signed   By: Donavan Foil M.D.   On: 03/11/2020 22:56   DG Abdomen Acute W/Chest  Result Date: 03/11/2020 CLINICAL DATA:  Epigastric pain, distension, nausea EXAM: DG ABDOMEN ACUTE W/ 1V CHEST COMPARISON:  None. FINDINGS: Supine and upright frontal views of the abdomen as well as an upright frontal view of the chest are obtained. The cardiac silhouette is unremarkable. No airspace disease, effusion, or pneumothorax. Bowel gas pattern is unremarkable. No masses or abnormal calcifications. No free gas in the greater peritoneal sac. IMPRESSION: 1. Unremarkable abdominal series. Electronically Signed   By: Randa Ngo M.D.   On: 03/11/2020 20:44    Procedures Procedures (including critical care time)  Medications Ordered in ED Medications  piperacillin-tazobactam (ZOSYN) IVPB 3.375 g (3.375 g Intravenous New Bag/Given 03/11/20 2316)  lactated ringers bolus 500 mL (has no administration in time range)  sodium chloride 0.9 % bolus 500 mL ( Intravenous Stopped 03/11/20 2150)  iohexol (OMNIPAQUE) 300 MG/ML solution 80 mL (80 mLs Intravenous Contrast Given 03/11/20 2226)    ED Course  I have reviewed the  triage vital signs and the nursing notes.  Pertinent labs & imaging results that were available during my care of the patient were reviewed by me and considered in my medical decision making (see chart for details).    MDM Rules/Calculators/A&P                          Patient presented after near syncopal episode.  However I think it is likely secondary to dehydration.  He has had right upper quadrant pain for last couple days.  White count mildly elevated and LFTs reassuring, but CT  scan done and showed likely cholecystitis although there are possible gallbladder stone at the neck and also maybe a common duct stone.  Discussed with Dr. Gaynelle Adu from general surgery.  Will admit to hospitalist.  Repeat lab work in the morning.  They will see him in the morning about possible surgery. Final Clinical Impression(s) / ED Diagnoses Final diagnoses:  Cholecystitis  AKI (acute kidney injury) [facility])    Rx / DC Orders ED Discharge Orders    None       Benjiman Core, MD 03/11/20 2325

## 2020-03-12 ENCOUNTER — Inpatient Hospital Stay (HOSPITAL_COMMUNITY): Payer: Medicare Other

## 2020-03-12 ENCOUNTER — Encounter (HOSPITAL_COMMUNITY): Payer: Self-pay | Admitting: Family Medicine

## 2020-03-12 DIAGNOSIS — K5710 Diverticulosis of small intestine without perforation or abscess without bleeding: Secondary | ICD-10-CM | POA: Diagnosis present

## 2020-03-12 DIAGNOSIS — K2270 Barrett's esophagus without dysplasia: Secondary | ICD-10-CM | POA: Diagnosis present

## 2020-03-12 DIAGNOSIS — K659 Peritonitis, unspecified: Secondary | ICD-10-CM | POA: Diagnosis present

## 2020-03-12 DIAGNOSIS — K2970 Gastritis, unspecified, without bleeding: Secondary | ICD-10-CM | POA: Diagnosis present

## 2020-03-12 DIAGNOSIS — N179 Acute kidney failure, unspecified: Secondary | ICD-10-CM | POA: Diagnosis present

## 2020-03-12 DIAGNOSIS — K810 Acute cholecystitis: Secondary | ICD-10-CM | POA: Diagnosis present

## 2020-03-12 DIAGNOSIS — K859 Acute pancreatitis without necrosis or infection, unspecified: Secondary | ICD-10-CM | POA: Diagnosis not present

## 2020-03-12 DIAGNOSIS — R1011 Right upper quadrant pain: Secondary | ICD-10-CM | POA: Diagnosis not present

## 2020-03-12 DIAGNOSIS — E7800 Pure hypercholesterolemia, unspecified: Secondary | ICD-10-CM | POA: Diagnosis present

## 2020-03-12 DIAGNOSIS — K3189 Other diseases of stomach and duodenum: Secondary | ICD-10-CM | POA: Diagnosis present

## 2020-03-12 DIAGNOSIS — Z7982 Long term (current) use of aspirin: Secondary | ICD-10-CM | POA: Diagnosis not present

## 2020-03-12 DIAGNOSIS — K760 Fatty (change of) liver, not elsewhere classified: Secondary | ICD-10-CM | POA: Diagnosis present

## 2020-03-12 DIAGNOSIS — R1013 Epigastric pain: Secondary | ICD-10-CM | POA: Diagnosis not present

## 2020-03-12 DIAGNOSIS — E860 Dehydration: Secondary | ICD-10-CM | POA: Diagnosis present

## 2020-03-12 DIAGNOSIS — I714 Abdominal aortic aneurysm, without rupture: Secondary | ICD-10-CM | POA: Diagnosis present

## 2020-03-12 DIAGNOSIS — K8067 Calculus of gallbladder and bile duct with acute and chronic cholecystitis with obstruction: Secondary | ICD-10-CM | POA: Diagnosis present

## 2020-03-12 DIAGNOSIS — E039 Hypothyroidism, unspecified: Secondary | ICD-10-CM | POA: Diagnosis present

## 2020-03-12 DIAGNOSIS — K8050 Calculus of bile duct without cholangitis or cholecystitis without obstruction: Secondary | ICD-10-CM | POA: Diagnosis not present

## 2020-03-12 DIAGNOSIS — M0609 Rheumatoid arthritis without rheumatoid factor, multiple sites: Secondary | ICD-10-CM | POA: Diagnosis present

## 2020-03-12 DIAGNOSIS — E785 Hyperlipidemia, unspecified: Secondary | ICD-10-CM | POA: Diagnosis present

## 2020-03-12 DIAGNOSIS — Z79899 Other long term (current) drug therapy: Secondary | ICD-10-CM | POA: Diagnosis not present

## 2020-03-12 DIAGNOSIS — Z8639 Personal history of other endocrine, nutritional and metabolic disease: Secondary | ICD-10-CM | POA: Diagnosis not present

## 2020-03-12 DIAGNOSIS — Z20822 Contact with and (suspected) exposure to covid-19: Secondary | ICD-10-CM | POA: Diagnosis present

## 2020-03-12 DIAGNOSIS — E876 Hypokalemia: Secondary | ICD-10-CM | POA: Diagnosis present

## 2020-03-12 DIAGNOSIS — Z79890 Hormone replacement therapy (postmenopausal): Secondary | ICD-10-CM | POA: Diagnosis not present

## 2020-03-12 DIAGNOSIS — Z87891 Personal history of nicotine dependence: Secondary | ICD-10-CM | POA: Diagnosis not present

## 2020-03-12 DIAGNOSIS — K8020 Calculus of gallbladder without cholecystitis without obstruction: Secondary | ICD-10-CM | POA: Diagnosis not present

## 2020-03-12 DIAGNOSIS — N1831 Chronic kidney disease, stage 3a: Secondary | ICD-10-CM | POA: Diagnosis present

## 2020-03-12 LAB — CBC WITH DIFFERENTIAL/PLATELET
Abs Immature Granulocytes: 0.03 10*3/uL (ref 0.00–0.07)
Basophils Absolute: 0 10*3/uL (ref 0.0–0.1)
Basophils Relative: 0 %
Eosinophils Absolute: 0.1 10*3/uL (ref 0.0–0.5)
Eosinophils Relative: 1 %
HCT: 39.3 % (ref 39.0–52.0)
Hemoglobin: 13 g/dL (ref 13.0–17.0)
Immature Granulocytes: 0 %
Lymphocytes Relative: 18 %
Lymphs Abs: 1.6 10*3/uL (ref 0.7–4.0)
MCH: 32.3 pg (ref 26.0–34.0)
MCHC: 33.1 g/dL (ref 30.0–36.0)
MCV: 97.8 fL (ref 80.0–100.0)
Monocytes Absolute: 1.5 10*3/uL — ABNORMAL HIGH (ref 0.1–1.0)
Monocytes Relative: 17 %
Neutro Abs: 5.5 10*3/uL (ref 1.7–7.7)
Neutrophils Relative %: 64 %
Platelets: 163 10*3/uL (ref 150–400)
RBC: 4.02 MIL/uL — ABNORMAL LOW (ref 4.22–5.81)
RDW: 12.5 % (ref 11.5–15.5)
WBC: 8.8 10*3/uL (ref 4.0–10.5)
nRBC: 0 % (ref 0.0–0.2)

## 2020-03-12 LAB — HEPATIC FUNCTION PANEL
ALT: 13 U/L (ref 0–44)
AST: 19 U/L (ref 15–41)
Albumin: 3.4 g/dL — ABNORMAL LOW (ref 3.5–5.0)
Alkaline Phosphatase: 80 U/L (ref 38–126)
Bilirubin, Direct: 0.4 mg/dL — ABNORMAL HIGH (ref 0.0–0.2)
Indirect Bilirubin: 2.7 mg/dL — ABNORMAL HIGH (ref 0.3–0.9)
Total Bilirubin: 3.1 mg/dL — ABNORMAL HIGH (ref 0.3–1.2)
Total Protein: 6.9 g/dL (ref 6.5–8.1)

## 2020-03-12 LAB — BASIC METABOLIC PANEL
Anion gap: 7 (ref 5–15)
BUN: 17 mg/dL (ref 8–23)
CO2: 25 mmol/L (ref 22–32)
Calcium: 8.6 mg/dL — ABNORMAL LOW (ref 8.9–10.3)
Chloride: 103 mmol/L (ref 98–111)
Creatinine, Ser: 1.3 mg/dL — ABNORMAL HIGH (ref 0.61–1.24)
GFR calc Af Amer: >60 mL/min (ref >60)
GFR calc non Af Amer: 52 mL/min — ABNORMAL LOW (ref >60)
Glucose, Bld: 123 mg/dL — ABNORMAL HIGH (ref 70–99)
Potassium: 4.1 mmol/L (ref 3.5–5.1)
Sodium: 135 mmol/L (ref 135–145)

## 2020-03-12 LAB — TYPE AND SCREEN
ABO/RH(D): AB POS
Antibody Screen: NEGATIVE

## 2020-03-12 LAB — SARS CORONAVIRUS 2 BY RT PCR (HOSPITAL ORDER, PERFORMED IN ~~LOC~~ HOSPITAL LAB): SARS Coronavirus 2: NEGATIVE

## 2020-03-12 LAB — ABO/RH: ABO/RH(D): AB POS

## 2020-03-12 MED ORDER — GADOBUTROL 1 MMOL/ML IV SOLN
8.0000 mL | Freq: Once | INTRAVENOUS | Status: AC | PRN
  Administered 2020-03-12: 8 mL via INTRAVENOUS

## 2020-03-12 MED ORDER — ACETAMINOPHEN 325 MG PO TABS
650.0000 mg | ORAL_TABLET | Freq: Four times a day (QID) | ORAL | Status: DC | PRN
  Administered 2020-03-16: 650 mg via ORAL
  Filled 2020-03-12: qty 2

## 2020-03-12 MED ORDER — ONDANSETRON HCL 4 MG/2ML IJ SOLN
4.0000 mg | Freq: Four times a day (QID) | INTRAMUSCULAR | Status: DC | PRN
  Administered 2020-03-16 (×2): 4 mg via INTRAVENOUS
  Filled 2020-03-12 (×2): qty 2

## 2020-03-12 MED ORDER — DEXTROSE-NACL 5-0.9 % IV SOLN: INTRAVENOUS | Status: DC

## 2020-03-12 MED ORDER — OXYCODONE HCL 5 MG PO TABS: 5.0000 mg | ORAL_TABLET | ORAL | Status: DC | PRN

## 2020-03-12 MED ORDER — LEVOTHYROXINE SODIUM 100 MCG/5ML IV SOLN
37.5000 ug | Freq: Every day | INTRAVENOUS | Status: DC
  Administered 2020-03-12 – 2020-03-13 (×2): 37.5 ug via INTRAVENOUS
  Filled 2020-03-12 (×2): qty 5

## 2020-03-12 MED ORDER — PIPERACILLIN-TAZOBACTAM 3.375 G IVPB
3.3750 g | Freq: Three times a day (TID) | INTRAVENOUS | Status: DC
  Administered 2020-03-12 – 2020-03-13 (×6): 3.375 g via INTRAVENOUS
  Administered 2020-03-14: 50 mL via INTRAVENOUS
  Administered 2020-03-14 – 2020-03-16 (×6): 3.375 g via INTRAVENOUS
  Filled 2020-03-12 (×14): qty 50

## 2020-03-12 MED ORDER — MORPHINE SULFATE (PF) 2 MG/ML IV SOLN: 1.0000 mg | INTRAVENOUS | Status: DC | PRN

## 2020-03-12 MED ORDER — LORAZEPAM 2 MG/ML IJ SOLN
1.0000 mg | Freq: Once | INTRAMUSCULAR | Status: AC | PRN
  Administered 2020-03-12: 1 mg via INTRAVENOUS
  Filled 2020-03-12: qty 1

## 2020-03-12 MED ORDER — ONDANSETRON HCL 4 MG PO TABS: 4.0000 mg | ORAL_TABLET | Freq: Four times a day (QID) | ORAL | Status: DC | PRN

## 2020-03-12 NOTE — Progress Notes (Signed)
Patient ID: Larry Salazar, male   DOB: [birth year], 79 y.o.   MRN: [id number] Patient's  abdominal MRI/MRCP showed the following:   1. Moderately motion degraded exam. 2. Cholelithiasis with suspicion of acute cholecystitis and possible cystic duct stone. 3. Isolated choledocholithiasis, as on CT. 4. At least 1 cystic focus within the pancreas. 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 Per consensus criteria, this warrants follow-up pre and post contrast abdominal MRI/MRCP at 2 years. This recommendation follows ACR consensus guidelines: Management of Incidental Pancreatic Cysts: A White Paper of the ACR Incidental Findings Committee. J Am Coll Radiol 2017;14:911-923. 5.  Aortic Atherosclerosis   He required Ativan in order to complete the MRI/MRCP, he is lethargic but arousable at this time. His wife is present. I reviewed the MRI/MRCP results with the patient's wife. I discussed the findings of choledocholithiasis to the distal duct which warrants an ERCP.  I discussed the benefits and risks of the ERCP with the patient's wife including risk with general anesthesia, risk of bleeding, infection and pancreatitis. I will re-review the MRI/MRCP results and ERCP benefits and risks with the patient tomorrow when he is more awake. ERCP is scheduled for Friday afternoon 03/14/2020 which was the earliest availability.   Ok to start clear liquid diet.

## 2020-03-12 NOTE — Consult Note (Signed)
Larry Salazar 1941-02-14  [id number].    Requesting MD: Dr. [place]  Chief Complaint/Reason for Consult: Cholecystitis   HPI: Larry Salazar is a 79 y.o. male with a history of HLD, hypothyroidism and rheumatoid arthritis on methotrexate who presented to the ED for syncope and was also noted to have upper abdominal pain.  Patient notes that on Sunday, 6/13 after eating homemade spaghetti and meatballs he began having a sharp (like a pin stuck in his abdomen), constant, moderate, 6/10 pain in his epigastrium with radiation to his right upper quadrant.  He notes the pain stayed constant over the next several days.  He had associated nausea on Monday and Tuesday.  Reports he had very little intake orally secondary to this other than some crackers.  He is unsure if oral intake made his symptoms worse.  He notes that palpation made his symptoms worse.  Ambulation made his symptoms better.  Yesterday he had a syncopal episode and went to the ED for evaluation.  He is found to have a WBC of 11.5, T bili of 2.7 and a CT with cholelithiasis, gallbladder wall thickening, pericholecystic fluid and dilated common bile duct. He was placed on abx and admitted to [facility]. We were asked to see. Patient is not on any blood thinners. He has never had any abdominal surgeries.   ROS: Review of Systems  Constitutional: Negative for chills and fever.  Respiratory: Positive for shortness of breath (son reports he is more sob with activitiy ). Negative for cough.   Cardiovascular: Negative for chest pain.  Gastrointestinal: Positive for abdominal pain and nausea. Negative for constipation, diarrhea, heartburn and vomiting.  Genitourinary: Negative for dysuria.  Musculoskeletal: Negative for back pain.  Skin: Negative for rash.  Neurological: Positive for loss of consciousness.  Psychiatric/Behavioral: Negative for substance abuse.  All other systems reviewed and are negative.   Family History  Problem Relation  Age of Onset  . Heart attack Father   . Hypertension Father   . Diabetes Brother   . Diabetes Sister   . Diabetes Sister   . Heart attack Son   . Healthy Son   . Healthy Daughter     Past Medical History:  Diagnosis Date  . High cholesterol   . Hypothyroidism   . Rheumatoid arthritis [facility])     Past Surgical History:  Procedure Laterality Date  . SHOULDER SURGERY Right     Social History:  reports that he quit smoking about 21 years ago. His smoking use included cigarettes. He has a 43.00 pack-year smoking history. He has never used smokeless tobacco. He reports previous alcohol use. He reports previous drug use.  Allergies: No Known Allergies  Medications Prior to Admission  Medication Sig Dispense Refill  . aspirin EC 81 MG tablet Take 81 mg by mouth daily. Swallow whole.    . folic acid (FOLVITE) 1 MG tablet Take 1 tablet (1 mg total) by mouth daily. 90 tablet 1  . levothyroxine (SYNTHROID, LEVOTHROID) 75 MCG tablet Take 75 mcg by mouth daily before breakfast.     . methotrexate (RHEUMATREX) 2.5 MG tablet Take 3 tablets (7.5 mg total) by mouth once a week. Caution:Chemotherapy. Protect from light. 36 tablet 0  . simvastatin (ZOCOR) 40 MG tablet Take 40 mg by mouth daily.     . predniSONE (DELTASONE) 5 MG tablet Take 4 tablets by mouth daily x4 days, 3 tablets by mouth daily x4 days, 2 tablets by mouth daily x4 days, 1 tablet  by mouth daily x4 days. 40 tablet 0     Physical Exam: Blood pressure 139/87, pulse (!) 52, temperature 97.8 F (36.6 C), temperature source Oral, resp. rate 16, height 5\' 5"  (1.651 m), weight 78 kg, SpO2 96 %. General: pleasant, WD/WN male who is laying in bed in NAD HEENT: head is normocephalic, atraumatic.  Sclera are noninjected.  PERRL.  Ears and nose without any masses or lesions.  Mouth is pink and moist. Upper dentures. Partials on lower.  Heart: regular, rate, and rhythm.  Normal s1,s2. No obvious murmurs, gallops, or rubs noted.  Palpable  pedal pulses bilaterally  Lungs: CTAB, no wheezes, rhonchi, or rales noted.  Respiratory effort nonlabored Abd: Soft, tenderness of the epigastrium and RUQ with positive Murphy's sign. +BS, no masses, hernias, or organomegaly MS: no BUE/BLE edema, calves soft and nontender Skin: warm and dry with no masses, lesions, or rashes Psych: A&Ox4 with an appropriate affect Neuro: cranial nerves grossly intact, equal strength in BUE/BLE bilaterally, normal speech, though process intact  Results for orders placed or performed during the hospital encounter of 03/11/20 (from the past 48 hour(s))  Troponin I (High Sensitivity)     Status: None   Collection Time: 03/11/20  8:22 PM  Result Value Ref Range   Troponin I (High Sensitivity) 5 <18 ng/L    Comment: (NOTE) Elevated high sensitivity troponin I (hsTnI) values and significant  changes across serial measurements may suggest ACS but many other  chronic and acute conditions are known to elevate hsTnI results.  Refer to the "Links" section for chest pain algorithms and additional  guidance. Performed at [facility], [address] [place], [place]   Comprehensive metabolic panel     Status: Abnormal   Collection Time: 03/11/20  8:22 PM  Result Value Ref Range   Sodium 135 135 - 145 mmol/L   Potassium 3.8 3.5 - 5.1 mmol/L   Chloride 100 98 - 111 mmol/L   CO2 25 22 - 32 mmol/L   Glucose, Bld 119 (H) 70 - 99 mg/dL    Comment: Glucose reference range applies only to samples taken after fasting for at least 8 hours.   BUN 19 8 - 23 mg/dL   Creatinine, Ser 03/13/20 (H) 0.61 - 1.24 mg/dL   Calcium 8.8 (L) 8.9 - 10.3 mg/dL   Total Protein 7.4 6.5 - 8.1 g/dL   Albumin 3.8 3.5 - 5.0 g/dL   AST 21 15 - 41 U/L   ALT 14 0 - 44 U/L   Alkaline Phosphatase 86 38 - 126 U/L   Total Bilirubin 2.7 (H) 0.3 - 1.2 mg/dL   GFR calc non Af Amer 46 (L) >60 mL/min   GFR calc Af Amer 54 (L) >60 mL/min   Anion gap 10 5 - 15    Comment: Performed  at [facility], [address] [place], [place]  Lipase, blood     Status: None   Collection Time: 03/11/20  8:22 PM  Result Value Ref Range   Lipase 20 11 - 51 U/L    Comment: Performed at [facility], [address] [place], [place]  CBC with Differential     Status: Abnormal   Collection Time: 03/11/20  8:22 PM  Result Value Ref Range   WBC 11.5 (H) 4.0 - 10.5 K/uL   RBC 4.35 4.22 - 5.81 MIL/uL   Hemoglobin 13.8 13.0 - 17.0 g/dL  HCT 42.1 39 - 52 %   MCV 96.8 80.0 - 100.0 fL   MCH 31.7 26.0 - 34.0 pg   MCHC 32.8 30.0 - 36.0 g/dL   RDW 16.112.5 09.611.5 - 04.515.5 %   Platelets 179 150 - 400 K/uL   nRBC 0.0 0.0 - 0.2 %   Neutrophils Relative % 72 %   Neutro Abs 8.2 (H) 1.7 - 7.7 K/uL   Lymphocytes Relative 11 %   Lymphs Abs 1.2 0.7 - 4.0 K/uL   Monocytes Relative 17 %   Monocytes Absolute 1.9 (H) 0 - 1 K/uL   Eosinophils Relative 0 %   Eosinophils Absolute 0.0 0 - 0 K/uL   Basophils Relative 0 %   Basophils Absolute 0.1 0 - 0 K/uL   Immature Granulocytes 0 %   Abs Immature Granulocytes 0.05 0.00 - 0.07 K/uL    Comment: Performed at [facility] [place], [address] [place], [place] [address]  Urinalysis, Routine w reflex microscopic     Status: Abnormal   Collection Time: 03/11/20  8:48 PM  Result Value Ref Range   Color, Urine YELLOW YELLOW   APPearance CLEAR CLEAR   Specific Gravity, Urine 1.025 1.005 - 1.030   pH 5.5 5.0 - 8.0   Glucose, UA NEGATIVE NEGATIVE mg/dL   Hgb urine dipstick TRACE (A) NEGATIVE   Bilirubin Urine NEGATIVE NEGATIVE   Ketones, ur NEGATIVE NEGATIVE mg/dL   Protein, ur NEGATIVE NEGATIVE mg/dL   Nitrite NEGATIVE NEGATIVE   Leukocytes,Ua NEGATIVE NEGATIVE    Comment: Performed at [facility] [place], [address] [place], [place] [address]  Urinalysis, Microscopic (reflex)     Status: Abnormal   Collection Time: 03/11/20  8:48 PM  Result Value Ref Range   RBC / HPF 0-5 0 - 5 RBC/hpf   WBC, UA  0-5 0 - 5 WBC/hpf   Bacteria, UA FEW (A) NONE SEEN   Squamous Epithelial / LPF 0-5 0 - 5   Mucus PRESENT    Hyaline Casts, UA PRESENT     Comment: Performed at [facility] [place], [address] [place], [place] [address]  SARS Coronavirus 2 by RT PCR (hospital order, performed in [facility] hospital lab) Nasopharyngeal Nasopharyngeal Swab     Status: None   Collection Time: 03/11/20 11:20 PM   Specimen: Nasopharyngeal Swab  Result Value Ref Range   SARS Coronavirus 2 NEGATIVE NEGATIVE    Comment: (NOTE) SARS-CoV-2 target nucleic acids are NOT DETECTED.  The SARS-CoV-2 RNA is generally detectable in upper and lower respiratory specimens during the acute phase of infection. The lowest concentration of SARS-CoV-2 viral copies this assay can detect is 250 copies / mL. A negative result does not preclude SARS-CoV-2 infection and should not be used as the sole basis for treatment or other patient management decisions.  A negative result may occur with improper specimen collection / handling, submission of specimen other than nasopharyngeal swab, presence of viral mutation(s) within the areas targeted by this assay, and inadequate number of viral copies (<250 copies / mL). A negative result must be combined with clinical observations, patient history, and epidemiological information.  Fact Sheet for Patients:   BoilerBrush.com.cyhttps://www.fda.gov/media/136312/download  Fact Sheet for Healthcare Providers: https://pope.com/https://www.fda.gov/media/136313/download  This test is not yet approved or  cleared by the [place] FDA and has been authorized for detection and/or diagnosis of SARS-CoV-2 by FDA under an Emergency Use Authorization (EUA).  This EUA will remain in effect (  meaning this test can be used) for the duration of the COVID-19 declaration under Section 564(b)(1) of the Act, 21 U.S.C. section 360bbb-3(b)(1), unless the authorization is terminated or revoked sooner.  Performed at [facility], [address] [place], [place] [address]   Hepatic function panel     Status: Abnormal   Collection Time: 03/12/20  4:37 AM  Result Value Ref Range   Total Protein 6.9 6.5 - 8.1 g/dL   Albumin 3.4 (L) 3.5 - 5.0 g/dL   AST 19 15 - 41 U/L   ALT 13 0 - 44 U/L   Alkaline Phosphatase 80 38 - 126 U/L   Total Bilirubin 3.1 (H) 0.3 - 1.2 mg/dL   Bilirubin, Direct 0.4 (H) 0.0 - 0.2 mg/dL   Indirect Bilirubin 2.7 (H) 0.3 - 0.9 mg/dL    Comment: Performed at [facility], [address], [place], [place] [address]  Basic metabolic panel     Status: Abnormal   Collection Time: 03/12/20  4:37 AM  Result Value Ref Range   Sodium 135 135 - 145 mmol/L   Potassium 4.1 3.5 - 5.1 mmol/L   Chloride 103 98 - 111 mmol/L   CO2 25 22 - 32 mmol/L   Glucose, Bld 123 (H) 70 - 99 mg/dL    Comment: Glucose reference range applies only to samples taken after fasting for at least 8 hours.   BUN 17 8 - 23 mg/dL   Creatinine, Ser 3.38 (H) 0.61 - 1.24 mg/dL   Calcium 8.6 (L) 8.9 - 10.3 mg/dL   GFR calc non Af Amer 52 (L) >60 mL/min   GFR calc Af Amer >60 >60 mL/min   Anion gap 7 5 - 15    Comment: Performed at [facility], [address] [place], [place] [address]  CBC WITH DIFFERENTIAL     Status: Abnormal   Collection Time: 03/12/20  4:37 AM  Result Value Ref Range   WBC 8.8 4.0 - 10.5 K/uL   RBC 4.02 (L) 4.22 - 5.81 MIL/uL   Hemoglobin 13.0 13.0 - 17.0 g/dL   HCT 97.6 39 - 52 %   MCV 97.8 80.0 - 100.0 fL   MCH 32.3 26.0 - 34.0 pg   MCHC 33.1 30.0 - 36.0 g/dL   RDW 73.4 19.3 - 79.0 %   Platelets 163 150 - 400 K/uL   nRBC 0.0 0.0 - 0.2 %   Neutrophils Relative % 64 %   Neutro Abs 5.5 1.7 - 7.7 K/uL   Lymphocytes Relative 18 %   Lymphs Abs 1.6 0.7 - 4.0 K/uL   Monocytes Relative 17 %   Monocytes Absolute 1.5 (H) 0 - 1 K/uL   Eosinophils Relative 1 %   Eosinophils Absolute 0.1 0 - 0 K/uL   Basophils Relative 0 %   Basophils Absolute 0.0 0 - 0  K/uL   Immature Granulocytes 0 %   Abs Immature Granulocytes 0.03 0.00 - 0.07 K/uL    Comment: Performed at [facility], [address] [place], [place] [address]  Type and screen [facility]     Status: None   Collection Time: 03/12/20  4:37 AM  Result Value Ref Range   ABO/RH(D) AB POS    Antibody Screen NEG    Sample Expiration      03/15/2020,2359 Performed at [facility], [address], [place], [place] [address]   ABO/Rh  Status: None   Collection Time: 03/12/20  4:37 AM  Result Value Ref Range   ABO/RH(D)      AB POS Performed at [facility], [address], [place], [place] [address]    CT ABDOMEN PELVIS W CONTRAST  Result Date: 03/11/2020 CLINICAL DATA:  Right upper quadrant and epigastric pain EXAM: CT ABDOMEN AND PELVIS WITH CONTRAST TECHNIQUE: Multidetector CT imaging of the abdomen and pelvis was performed using the standard protocol following bolus administration of intravenous contrast. CONTRAST:  70mL OMNIPAQUE IOHEXOL 300 MG/ML  SOLN COMPARISON:  Radiograph 03/11/2020 FINDINGS: Lower chest: No acute abnormality. Hepatobiliary: Subcentimeter hypodensity in the right hepatic lobe too small to further characterize. Dilated gallbladder with wall thickening and/or pericholecystic fluid. Mild soft tissue stranding in the right upper quadrant consistent with edema. Probable small calcified stones best seen on coronal views. Mildly enhancing ductal walls on the coronal views. Common bile duct upper normal at 7 mm. Possible common duct stone, series 2, image number 34, coronal series 5, image number 43. Pancreas: Unremarkable. No pancreatic ductal dilatation or surrounding inflammatory changes. Spleen: Normal in size without focal abnormality. Adrenals/Urinary Tract: Adrenal glands are normal. Cyst in the mid right kidney. No hydronephrosis. Bladder is normal Stomach/Bowel: Stomach is within normal  limits. Appendix appears normal. Sigmoid colon diverticular disease without acute inflammatory change. No evidence of bowel wall thickening, distention, or inflammatory changes. Vascular/Lymphatic: Moderate aortic atherosclerosis. 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 No suspicious nodes Reproductive: Enlarged prostate Other: No free air or free fluid. Musculoskeletal: Degenerative changes of the spine. No acute or suspicious osseous abnormality IMPRESSION: 1. Dilated gallbladder with suspected punctate stones. There is gallbladder wall thickening or pericholecystic fluid with soft tissue inflammatory process in the right upper quadrant, constellation of findings is concerning for an acute cholecystitis. Borderline dilatation of common bile duct with possible small filling defect or stone in the distal duct, correlate with LFTs. Follow-up MRCP as indicated. 2. 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 Recommend followup by ultrasound in 2 years. This recommendation follows ACR consensus guidelines: White Paper of the ACR Incidental Findings Committee II on Vascular Findings. J Am Coll Radiol 2013; 10:789-794. Aortic aneurysm NOS (ICD10-I71.9) 3. Sigmoid colon diverticular disease without acute inflammatory change. Electronically Signed   By: Donavan Foil M.D.   On: 03/11/2020 22:56   DG Abdomen Acute W/Chest  Result Date: 03/11/2020 CLINICAL DATA:  Epigastric pain, distension, nausea EXAM: DG ABDOMEN ACUTE W/ 1V CHEST COMPARISON:  None. FINDINGS: Supine and upright frontal views of the abdomen as well as an upright frontal view of the chest are obtained. The cardiac silhouette is unremarkable. No airspace disease, effusion, or pneumothorax. Bowel gas pattern is unremarkable. No masses or abnormal calcifications. No free gas in the greater peritoneal sac. IMPRESSION: 1. Unremarkable abdominal series. Electronically Signed   By: Randa Ngo  M.D.   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  Will get MRCP to r/o CBD stone. Will contact GI  - Agree with IV antibiotics and n.p.o. -Patient will likely need Lap Chole during admission. I have explained the procedure, risks, and aftercare of cholecystectomy.  Risks include but are not limited to bleeding, infection, wound problems, anesthesia, diarrhea, bile leak, injury to common bile duct/liver/intestine.  He seems to understand and agrees to proceed when appropriate.   FEN - NPO, IVF VTE - SCDs, okay for chemical prophylaxis from a general surgery standpoint ID - Zosyn 6/15 >>   Jacinto Halim, [facility] 03/12/2020, 10:15 AM Please see Amion for pager number during day hours 7:00am-4:30pm

## 2020-03-12 NOTE — H&P (Signed)
History and Physical    Bernadette Armijo ZDG:[id number] DOB: Aug 10, 1941 DOA: 03/11/2020  PCP: Heron Nay, PA  Patient coming from: Home.  Chief Complaint: Weakness.  Abdominal pain.  HPI: Larry Salazar is a 79 y.o. male with history of rheumatoid arthritis hypothyroidism chronic kidney disease stage III had come to the ER the patient felt dizzy and fell off onto the floor when he was trying to get out of the car.  Had gone to the urgent care was referred to the ER.  Patient states he was having no complaints of any chest pain or shortness of breath or palpitation prior to getting out of the car but when he got out of the car he felt weak and dizzy and fell onto the floor.  Did not hit his head or lose consciousness.  Following which he has been having no further complaints.  But he has been having some abdominal discomfort the last 2 weeks mostly epigastric area with no nausea vomiting or diarrhea.  ED Course: In the ER on exam patient has right upper quadrant tenderness.  Patient appears nonfocal EKG shows normal sinus rhythm.  Labs are remarkable for total bilirubin of 2.7 WBC count was 11.5 high sensitive troponin was 5.  CT abdomen pelvis with showing features concerning for acute cholecystitis.  There was also concern for CBD stone.  General surgery was consulted and patient admitted for further management.  Covid test negative.  Review of Systems: As per HPI, rest all negative.   Past Medical History:  Diagnosis Date  . High cholesterol   . Hypothyroidism   . Rheumatoid arthritis [facility])     Past Surgical History:  Procedure Laterality Date  . SHOULDER SURGERY Right      reports that he quit smoking about 21 years ago. His smoking use included cigarettes. He has a 43.00 pack-year smoking history. He has never used smokeless tobacco. He reports previous alcohol use. He reports previous drug use.  No Known Allergies  Family History  Problem Relation Age of Onset  . Heart  attack Father   . Hypertension Father   . Diabetes Brother   . Diabetes Sister   . Diabetes Sister   . Heart attack Son   . Healthy Son   . Healthy Daughter     Prior to Admission medications   Medication Sig Start Date End Date Taking? Authorizing Provider  aspirin EC 81 MG tablet Take 81 mg by mouth daily. Swallow whole.   Yes [provider]  folic acid (FOLVITE) 1 MG tablet Take 1 tablet (1 mg total) by mouth daily. 01/16/20  Yes Deveshwar, Janalyn Rouse, MD  levothyroxine (SYNTHROID, LEVOTHROID) 75 MCG tablet Take 75 mcg by mouth daily before breakfast.  06/30/18  Yes [provider]  methotrexate (RHEUMATREX) 2.5 MG tablet Take 3 tablets (7.5 mg total) by mouth once a week. Caution:Chemotherapy. Protect from light. 01/16/20  Yes Deveshwar, Janalyn Rouse, MD  simvastatin (ZOCOR) 40 MG tablet Take 40 mg by mouth daily.  06/30/18  Yes [provider]  predniSONE (DELTASONE) 5 MG tablet Take 4 tablets by mouth daily x4 days, 3 tablets by mouth daily x4 days, 2 tablets by mouth daily x4 days, 1 tablet by mouth daily x4 days. 01/15/20   Yopp, Amber C, RPH-CPP    Physical Exam: Constitutional: Moderately built and nourished. Vitals:   03/11/20 2302 03/11/20 2332 03/12/20 0002 03/12/20 0050  BP: 128/77 125/78 116/81 (!) 146/86  Pulse: 63 66 65 60  Resp: (!)  23 (!) 24 18 18   Temp:    98.4 F (36.9 C)  TempSrc:    Oral  SpO2: 94% 94% 94% 97%  Weight:      Height:       Eyes: Anicteric no pallor. ENMT: No discharge from the ears eyes nose or mouth. Neck: No mass felt.  No neck rigidity. Respiratory: No rhonchi or crepitations. Cardiovascular: S1-S2 heard. Abdomen: Right upper quadrant tenderness no guarding rigidity. Musculoskeletal: No edema. Skin: No rash. Neurologic: Alert awake oriented to time place and person.  Moves all extremities. Psychiatric: Appears normal per normal affect.   Labs on Admission: I have personally reviewed following labs and imaging  studies  CBC: Recent Labs  Lab 03/11/20 2022  WBC 11.5*  NEUTROABS 8.2*  HGB 13.8  HCT 42.1  MCV 96.8  PLT 179   Basic Metabolic Panel: Recent Labs  Lab 03/11/20 2022  NA 135  K 3.8  CL 100  CO2 25  GLUCOSE 119*  BUN 19  CREATININE 1.44*  CALCIUM 8.8*   GFR: Estimated Creatinine Clearance: 40.7 mL/min (A) (by C-G formula based on SCr of 1.44 mg/dL (H)). Liver Function Tests: Recent Labs  Lab 03/11/20 2022  AST 21  ALT 14  ALKPHOS 86  BILITOT 2.7*  PROT 7.4  ALBUMIN 3.8   Recent Labs  Lab 03/11/20 2022  LIPASE 20   No results for input(s): AMMONIA in the last 168 hours. Coagulation Profile: No results for input(s): INR, PROTIME in the last 168 hours. Cardiac Enzymes: No results for input(s): CKTOTAL, CKMB, CKMBINDEX, TROPONINI in the last 168 hours. BNP (last 3 results) No results for input(s): PROBNP in the last 8760 hours. HbA1C: No results for input(s): HGBA1C in the last 72 hours. CBG: No results for input(s): GLUCAP in the last 168 hours. Lipid Profile: No results for input(s): CHOL, HDL, LDLCALC, TRIG, CHOLHDL, LDLDIRECT in the last 72 hours. Thyroid Function Tests: No results for input(s): TSH, T4TOTAL, FREET4, T3FREE, THYROIDAB in the last 72 hours. Anemia Panel: No results for input(s): VITAMINB12, FOLATE, FERRITIN, TIBC, IRON, RETICCTPCT in the last 72 hours. Urine analysis:    Component Value Date/Time   COLORURINE YELLOW 03/11/2020 2048   APPEARANCEUR CLEAR 03/11/2020 2048   LABSPEC 1.025 03/11/2020 2048   PHURINE 5.5 03/11/2020 2048   GLUCOSEU NEGATIVE 03/11/2020 2048   HGBUR TRACE (A) 03/11/2020 2048   BILIRUBINUR NEGATIVE 03/11/2020 2048   KETONESUR NEGATIVE 03/11/2020 2048   PROTEINUR NEGATIVE 03/11/2020 2048   NITRITE NEGATIVE 03/11/2020 2048   LEUKOCYTESUR NEGATIVE 03/11/2020 2048   Sepsis Labs: @LABRCNTIP (procalcitonin:4,lacticidven:4) ) Recent Results (from the past 240 hour(s))  SARS Coronavirus 2 by RT PCR (hospital  order, performed in [facility] hospital lab) Nasopharyngeal Nasopharyngeal Swab     Status: None   Collection Time: 03/11/20 11:20 PM   Specimen: Nasopharyngeal Swab  Result Value Ref Range Status   SARS Coronavirus 2 NEGATIVE NEGATIVE Final    Comment: (NOTE) SARS-CoV-2 target nucleic acids are NOT DETECTED.  The SARS-CoV-2 RNA is generally detectable in upper and lower respiratory specimens during the acute phase of infection. The lowest concentration of SARS-CoV-2 viral copies this assay can detect is 250 copies / mL. A negative result does not preclude SARS-CoV-2 infection and should not be used as the sole basis for treatment or other patient management decisions.  A negative result may occur with improper specimen collection / handling, submission of specimen other than nasopharyngeal swab, presence of viral mutation(s) within the areas targeted  by this assay, and inadequate number of viral copies (<250 copies / mL). A negative result must be combined with clinical observations, patient history, and epidemiological information.  Fact Sheet for Patients:   StrictlyIdeas.no  Fact Sheet for Healthcare Providers: BankingDealers.co.za  This test is not yet approved or  cleared by the [place] FDA and has been authorized for detection and/or diagnosis of SARS-CoV-2 by FDA under an Emergency Use Authorization (EUA).  This EUA will remain in effect (meaning this test can be used) for the duration of the COVID-19 declaration under Section 564(b)(1) of the Act, 21 U.S.C. section 360bbb-3(b)(1), unless the authorization is terminated or revoked sooner.  Performed at [facility], [address] [place], [place] [address]      Radiological Exams on Admission: CT ABDOMEN PELVIS W CONTRAST  Result Date: 03/11/2020 CLINICAL DATA:  Right upper quadrant and epigastric pain EXAM: CT ABDOMEN AND PELVIS WITH CONTRAST  TECHNIQUE: Multidetector CT imaging of the abdomen and pelvis was performed using the standard protocol following bolus administration of intravenous contrast. CONTRAST:  9mL OMNIPAQUE IOHEXOL 300 MG/ML  SOLN COMPARISON:  Radiograph 03/11/2020 FINDINGS: Lower chest: No acute abnormality. Hepatobiliary: Subcentimeter hypodensity in the right hepatic lobe too small to further characterize. Dilated gallbladder with wall thickening and/or pericholecystic fluid. Mild soft tissue stranding in the right upper quadrant consistent with edema. Probable small calcified stones best seen on coronal views. Mildly enhancing ductal walls on the coronal views. Common bile duct upper normal at 7 mm. Possible common duct stone, series 2, image number 34, coronal series 5, image number 43. Pancreas: Unremarkable. No pancreatic ductal dilatation or surrounding inflammatory changes. Spleen: Normal in size without focal abnormality. Adrenals/Urinary Tract: Adrenal glands are normal. Cyst in the mid right kidney. No hydronephrosis. Bladder is normal Stomach/Bowel: Stomach is within normal limits. Appendix appears normal. Sigmoid colon diverticular disease without acute inflammatory change. No evidence of bowel wall thickening, distention, or inflammatory changes. Vascular/Lymphatic: Moderate aortic atherosclerosis. 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 No suspicious nodes Reproductive: Enlarged prostate Other: No free air or free fluid. Musculoskeletal: Degenerative changes of the spine. No acute or suspicious osseous abnormality IMPRESSION: 1. Dilated gallbladder with suspected punctate stones. There is gallbladder wall thickening or pericholecystic fluid with soft tissue inflammatory process in the right upper quadrant, constellation of findings is concerning for an acute cholecystitis. Borderline dilatation of common bile duct with possible small filling defect or stone in the distal duct,  correlate with LFTs. Follow-up MRCP as indicated. 2. 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 Recommend followup by ultrasound in 2 years. This recommendation follows ACR consensus guidelines: White Paper of the ACR Incidental Findings Committee II on Vascular Findings. J Am Coll Radiol 2013; 10:789-794. Aortic aneurysm NOS (ICD10-I71.9) 3. Sigmoid colon diverticular disease without acute inflammatory change. Electronically Signed   By: Donavan Foil M.D.   On: 03/11/2020 22:56   DG Abdomen Acute W/Chest  Result Date: 03/11/2020 CLINICAL DATA:  Epigastric pain, distension, nausea EXAM: DG ABDOMEN ACUTE W/ 1V CHEST COMPARISON:  None. FINDINGS: Supine and upright frontal views of the abdomen as well as an upright frontal view of the chest are obtained. The cardiac silhouette is unremarkable. No airspace disease, effusion, or pneumothorax. Bowel gas pattern is unremarkable. No masses or abnormal calcifications. No free gas in the greater peritoneal sac. IMPRESSION: 1. Unremarkable abdominal series. Electronically Signed   By: Diana Eves.D.  On: 03/11/2020 20:44    EKG: Independently reviewed.  Normal sinus rhythm.  QTC 426 ms.  Assessment/Plan Principal Problem:   Acute cholecystitis Active Problems:   Rheumatoid arthritis, seronegative, multiple sites (HCC)   History of hypothyroidism    1. Acute cholecystitis with possible stone in the CBD -general surgery has been consulted patient has been placed on empiric antibiotics IV fluids pain relief medication and kept n.p.o. 2. Near syncope cause not clear.  EKG unremarkable high sensitive ovarian negative patient appears nonfocal.  We will continue to monitor. 3. History of rheumatoid arthritis holding methotrexate for now. 4. Hypothyroidism we will keep Synthroid IV for now. 5. Chronic kidney disease stage III creatinine appears to be at baseline.  Given that patient will need possible surgery for the acute  cholecystitis will need more than 2 midnight stay inpatient status.   DVT prophylaxis: SCDs.  In anticipation of possible surgery will avoid pharmacological DVT prophylaxis for now. Code Status: Full code. Family Communication: Discussed with patient. Disposition Plan: Home. Consults called: General surgery. Admission status: Inpatient.   Eduard Clos MD [facility] Pager [phone].  If 7PM-7AM, please contact night-coverage [email or web address] Password [facility]  03/12/2020, 4:21 AM

## 2020-03-12 NOTE — Progress Notes (Signed)
PROGRESS NOTE    Larry Salazar  CBS:[id number] DOB: May 29, 1941 DOA: 03/11/2020 PCP: Heron Nay, PA    Brief Narrative:  Larry Salazar is a 79 y.o. male with history of rheumatoid arthritis hypothyroidism chronic kidney disease stage III had come to the ER the patient felt dizzy and fell off onto the floor when he was trying to get out of the car.  Had gone to the urgent care was referred to the ER.  Patient states he was having no complaints of any chest pain or shortness of breath or palpitation prior to getting out of the car but when he got out of the car he felt weak and dizzy and fell onto the floor.  Did not hit his head or lose consciousness.  Following which he has been having no further complaints.  But he has been having some abdominal discomfort the last 2 weeks mostly epigastric area with no nausea vomiting or diarrhea.  In the ER on exam patient has right upper quadrant tenderness.  Patient appears nonfocal EKG shows normal sinus rhythm.  Labs are remarkable for total bilirubin of 2.7 WBC count was 11.5 high sensitive troponin was 5.  CT abdomen pelvis with showing features concerning for acute cholecystitis.  There was also concern for CBD stone.  General surgery was consulted and patient admitted for further management.  Covid test negative.   Assessment & Plan:   Principal Problem:   Acute cholecystitis Active Problems:   Rheumatoid arthritis, seronegative, multiple sites [facility])   History of hypothyroidism   Acute cholecystitis Patient presenting as a transfer from med [facility] with poor appetite, abdominal pain.  Elevated WBC 11.5.  Total bilirubin 3.1.  CT abdomen/pelvis notable for dilated gallbladder with wall thickening/pericholecystic fluid and borderline dilation of common bile duct that is concerning for acute cholecystitis. --General surgery consulted --N.p.o. --D5 NS at 75 mL's per hour --Continue empiric antibiotics with Zosyn  Near syncopal  episode Suspect etiology likely secondary to poor oral intake and dehydration with acute infection as above.  EKG with no acute findings. --Continue IV fluid hydration and antibiotics as above --Continue to monitor on telemetry  Hypothyroidism On levothyroxine 75 mcg p.o. daily at home. --Continue levothyroxine IV dosed at 37.5 mcg while n.p.o.  CKD stage IIIa Creatinine stable, 1.44-->1.30 (baseline 1.1-1.2) --Continue IV fluids as above while n.p.o. --Follow BMP daily  History of rheumatoid arthritis --Continue to hold home methotrexate   DVT prophylaxis: SCDs, holding chemical DVT prophylaxis in anticipation of surgical procedure Code Status: Full code Family Communication: Discussed with patient extensively at bedside, no family present  Disposition Plan:  Status is: Inpatient  Remains inpatient appropriate because:Ongoing active pain requiring inpatient pain management, Unsafe d/c plan and IV treatments appropriate due to intensity of illness or inability to take PO   Dispo: The patient is from: Home              Anticipated d/c is to: Home              Anticipated d/c date is: 2 days              Patient currently is not medically stable to d/c.     Consultants:   General surgery  Procedures:   None  Antimicrobials:   Zosyn   Subjective: Patient seen and examined at bedside, resting comfortably.  Continues with epigastric/right upper quadrant pain.  No other complaints at this time.  Denies headache, no fever/chills/night sweats, no current  nausea, no chest pain, no palpitations, no shortness of breath, no weakness, no fatigue.  No acute events overnight per nursing staff.  Objective: Vitals:   03/11/20 2332 03/12/20 0002 03/12/20 0050 03/12/20 0440  BP: 125/78 116/81 (!) 146/86 139/87  Pulse: 66 65 60 (!) 52  Resp: (!) 24 18 18 16   Temp:   98.4 F (36.9 C) 97.8 F (36.6 C)  TempSrc:   Oral Oral  SpO2: 94% 94% 97% 96%  Weight:      Height:         Intake/Output Summary (Last 24 hours) at 03/12/2020 0839 Last data filed at 03/12/2020 0600 Gross per 24 hour  Intake 1367.11 ml  Output --  Net 1367.11 ml   Filed Weights   03/11/20 1922  Weight: 78 kg    Examination:  General exam: Appears calm and comfortable  Respiratory system: Clear to auscultation. Respiratory effort normal.  Oxygenating well on room air Cardiovascular system: S1 & S2 heard, RRR. No JVD, murmurs, rubs, gallops or clicks. No pedal edema. Gastrointestinal system: Abdomen is nondistended, soft, with tenderness to palpation epigastrium and right upper quadrant. No organomegaly or masses felt. Normal bowel sounds heard. Central nervous system: Alert and oriented. No focal neurological deficits. Extremities: Symmetric 5 x 5 power. Skin: No rashes, lesions or ulcers Psychiatry: Judgement and insight appear normal. Mood & affect appropriate.     Data Reviewed: I have personally reviewed following labs and imaging studies  CBC: Recent Labs  Lab 03/11/20 2022 03/12/20 0437  WBC 11.5* 8.8  NEUTROABS 8.2* 5.5  HGB 13.8 13.0  HCT 42.1 39.3  MCV 96.8 97.8  PLT 179 163   Basic Metabolic Panel: Recent Labs  Lab 03/11/20 2022 03/12/20 0437  NA 135 135  K 3.8 4.1  CL 100 103  CO2 25 25  GLUCOSE 119* 123*  BUN 19 17  CREATININE 1.44* 1.30*  CALCIUM 8.8* 8.6*   GFR: Estimated Creatinine Clearance: 45.1 mL/min (A) (by C-G formula based on SCr of 1.3 mg/dL (H)). Liver Function Tests: Recent Labs  Lab 03/11/20 2022 03/12/20 0437  AST 21 19  ALT 14 13  ALKPHOS 86 80  BILITOT 2.7* 3.1*  PROT 7.4 6.9  ALBUMIN 3.8 3.4*   Recent Labs  Lab 03/11/20 2022  LIPASE 20   No results for input(s): AMMONIA in the last 168 hours. Coagulation Profile: No results for input(s): INR, PROTIME in the last 168 hours. Cardiac Enzymes: No results for input(s): CKTOTAL, CKMB, CKMBINDEX, TROPONINI in the last 168 hours. BNP (last 3 results) No results for  input(s): PROBNP in the last 8760 hours. HbA1C: No results for input(s): HGBA1C in the last 72 hours. CBG: No results for input(s): GLUCAP in the last 168 hours. Lipid Profile: No results for input(s): CHOL, HDL, LDLCALC, TRIG, CHOLHDL, LDLDIRECT in the last 72 hours. Thyroid Function Tests: No results for input(s): TSH, T4TOTAL, FREET4, T3FREE, THYROIDAB in the last 72 hours. Anemia Panel: No results for input(s): VITAMINB12, FOLATE, FERRITIN, TIBC, IRON, RETICCTPCT in the last 72 hours. Sepsis Labs: No results for input(s): PROCALCITON, LATICACIDVEN in the last 168 hours.  Recent Results (from the past 240 hour(s))  SARS Coronavirus 2 by RT PCR (hospital order, performed in [facility] hospital lab) Nasopharyngeal Nasopharyngeal Swab     Status: None   Collection Time: 03/11/20 11:20 PM   Specimen: Nasopharyngeal Swab  Result Value Ref Range Status   SARS Coronavirus 2 NEGATIVE NEGATIVE Final    Comment: (NOTE) SARS-CoV-2  target nucleic acids are NOT DETECTED.  The SARS-CoV-2 RNA is generally detectable in upper and lower respiratory specimens during the acute phase of infection. The lowest concentration of SARS-CoV-2 viral copies this assay can detect is 250 copies / mL. A negative result does not preclude SARS-CoV-2 infection and should not be used as the sole basis for treatment or other patient management decisions.  A negative result may occur with improper specimen collection / handling, submission of specimen other than nasopharyngeal swab, presence of viral mutation(s) within the areas targeted by this assay, and inadequate number of viral copies (<250 copies / mL). A negative result must be combined with clinical observations, patient history, and epidemiological information.  Fact Sheet for Patients:   BoilerBrush.com.cy  Fact Sheet for Healthcare Providers: https://pope.com/  This test is not yet approved or  cleared  by the [place] FDA and has been authorized for detection and/or diagnosis of SARS-CoV-2 by FDA under an Emergency Use Authorization (EUA).  This EUA will remain in effect (meaning this test can be used) for the duration of the COVID-19 declaration under Section 564(b)(1) of the Act, 21 U.S.C. section 360bbb-3(b)(1), unless the authorization is terminated or revoked sooner.  Performed at [facility], [address] [place], [place] [address]          Radiology Studies: CT ABDOMEN PELVIS W CONTRAST  Result Date: 03/11/2020 CLINICAL DATA:  Right upper quadrant and epigastric pain EXAM: CT ABDOMEN AND PELVIS WITH CONTRAST TECHNIQUE: Multidetector CT imaging of the abdomen and pelvis was performed using the standard protocol following bolus administration of intravenous contrast. CONTRAST:  52mL OMNIPAQUE IOHEXOL 300 MG/ML  SOLN COMPARISON:  Radiograph 03/11/2020 FINDINGS: Lower chest: No acute abnormality. Hepatobiliary: Subcentimeter hypodensity in the right hepatic lobe too small to further characterize. Dilated gallbladder with wall thickening and/or pericholecystic fluid. Mild soft tissue stranding in the right upper quadrant consistent with edema. Probable small calcified stones best seen on coronal views. Mildly enhancing ductal walls on the coronal views. Common bile duct upper normal at 7 mm. Possible common duct stone, series 2, image number 34, coronal series 5, image number 43. Pancreas: Unremarkable. No pancreatic ductal dilatation or surrounding inflammatory changes. Spleen: Normal in size without focal abnormality. Adrenals/Urinary Tract: Adrenal glands are normal. Cyst in the mid right kidney. No hydronephrosis. Bladder is normal Stomach/Bowel: Stomach is within normal limits. Appendix appears normal. Sigmoid colon diverticular disease without acute inflammatory change. No evidence of bowel wall thickening, distention, or inflammatory changes. Vascular/Lymphatic:  Moderate aortic atherosclerosis. 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 No suspicious nodes Reproductive: Enlarged prostate Other: No free air or free fluid. Musculoskeletal: Degenerative changes of the spine. No acute or suspicious osseous abnormality IMPRESSION: 1. Dilated gallbladder with suspected punctate stones. There is gallbladder wall thickening or pericholecystic fluid with soft tissue inflammatory process in the right upper quadrant, constellation of findings is concerning for an acute cholecystitis. Borderline dilatation of common bile duct with possible small filling defect or stone in the distal duct, correlate with LFTs. Follow-up MRCP as indicated. 2. 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 Recommend followup by ultrasound in 2 years. This recommendation follows ACR consensus guidelines: White Paper of the ACR Incidental Findings Committee II on Vascular Findings. J Am Coll Radiol 2013; 10:789-794. Aortic aneurysm NOS (ICD10-I71.9) 3. Sigmoid colon diverticular disease without acute inflammatory change. Electronically Signed   By: Jasmine Pang M.D.   On:  03/11/2020 22:56   DG Abdomen Acute W/Chest  Result Date: 03/11/2020 CLINICAL DATA:  Epigastric pain, distension, nausea EXAM: DG ABDOMEN ACUTE W/ 1V CHEST COMPARISON:  None. FINDINGS: Supine and upright frontal views of the abdomen as well as an upright frontal view of the chest are obtained. The cardiac silhouette is unremarkable. No airspace disease, effusion, or pneumothorax. Bowel gas pattern is unremarkable. No masses or abnormal calcifications. No free gas in the greater peritoneal sac. IMPRESSION: 1. Unremarkable abdominal series. Electronically Signed   By: Randa Ngo M.D.   On: 03/11/2020 20:44        Scheduled Meds: . levothyroxine  37.5 mcg Intravenous Daily   Continuous Infusions: . dextrose 5 % and 0.9% NaCl 75 mL/hr at 03/12/20 0600  .  piperacillin-tazobactam (ZOSYN)  IV 12.5 mL/hr at 03/12/20 0600     LOS: 0 days    Time spent: 35 minutes spent on chart review, discussion with nursing staff, consultants, updating family and interview/physical exam; more than 50% of that time was spent in counseling and/or coordination of care.    Ciena Sampley J [place] DO [facility] Available via Epic secure chat 7am-7pm After these hours, please refer to coverage provider listed on [email or web address] 03/12/2020, 8:39 AM

## 2020-03-12 NOTE — ED Notes (Signed)
Patient ambulatory with bathroom.

## 2020-03-12 NOTE — ED Notes (Signed)
Called patient's spouse and updated on room assignment and patient's transport to [facility]

## 2020-03-12 NOTE — Consult Note (Signed)
Referring Provider: Alferd Apa PA-C Primary Care Physician:   Marinda Elk at [facility]  Primary Gastroenterologist:  Althia Forts   Reason for Consultation:  RUQ pain, possible distal common bile duct stones   HPI: Larry Salazar is a 79 y.o. male with a past medical history of hyperlipidemia, hypothyroidism, rheumatoid arthritis on methotrexate and memory issues ( not formally diagnosed with dementia).  Sunday evening 6/13 he ate any with meatballs, garlic bread and dessert prepared by his wife.  Approximately 2 hours later he developed central upper abdominal pain and nausea.  He took 2 Tums and went to bed.  The next day he continued to have epigastric and right upper quadrant abdominal pain with mild nausea.  No vomiting.  He felt fatigued and stayed home the entire day.  On Tuesday 6/15, he accompanied his wife to her doctors appointments then to the [employer] store.  When attempting to get out of the car at the [employer] store, he felt dizzy and fell out of the car.  He landed on his left leg with a minor abrasion.  He did not hit his head.  His wife stated he was disoriented but coherent.  She ran into the store and 2 of the workers assisted him back into the  car.  They presented to [facility] urgent care.  His wife stated an EKG was done but was abnormal and he was advised to see a cardiologist. His abdominal pain was not assessed as the primary focus was on his weakness and fall from the car.  He and his wife then drove to [facility] emergency room for further evaluation, for a second opinion.  In the ED, his labs showed a mildly elevated WBC 11.5. Total bili 2.7. Normal AST and ALT levels. An abdominal/pelvic CT with contrast identified a dilated gallbladder with suspected punctate stones, gallbladder wall thickening with soft tissue inflammatory process in the right upper quadrant concerning for acute cholecystitis and borderline dilatation of the common  bile duct with possible small filling defect or stone.  He was transferred to [facility] for admission. Surgical and GI consults requested.  Currently, he denies having any nausea or vomiting.  He is NPO.  He complains of having epigastric and right upper quadrant abdominal pain if he moves in bed otherwise he is comfortable.  He denies having any chest pain or palpitations. He has some SOB with exertion. No cough.  He is passing 1-2 soft to loose stools daily.  He stated he does not look at his stool therefore he does not know the color and cannot verify if he has any rectal bleeding or melena.  His urine has been a little darker over the past week or so.  He takes aspirin 81 mg daily.  He takes ibuprofen 200 mg 2 tabs once weekly or less for aches and pains.  He denies having any dysphagia or heartburn.  No lower abdominal pain.  He has never had an EGD.  He is undergone several colonoscopies in the past by GI at [facility].  His wife reported his last colonoscopy was approximately 5 years ago and was normal.  No family history of colorectal cancer.    ED course: Sodium 135. Potassium 3.8. Glucose 119. BUN 19. Creatinine 1.44. Calcium 8.8. Lipase 20. AST 21. ALT 14. Total bili 2.7. WBC 11.5. Hemoglobin 13.8. Hematocrit 42.1. MCV 96.8. Platelet 179. Troponin 5.   Abdominal/pelvic CT with contrast 03/11/2020: 1. Dilated gallbladder  with suspected punctate stones. There is gallbladder wall thickening or pericholecystic fluid with soft tissue inflammatory process in the right upper quadrant, constellation of findings is concerning for an acute cholecystitis. Borderline dilatation of common bile duct with possible small filling defect or stone in the distal duct, correlate with LFTs. Follow-up MRCP as indicated. 2. 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 Recommend followup by ultrasound in 2 years. This recommendation follows ACR consensus guidelines: White  Paper of the ACR Incidental Findings Committee II on Vascular Findings. J Am Coll Radiol 2013; 10:789-794. Aortic aneurysm NOS (ICD10-I71.9) 3. Sigmoid colon diverticular disease without acute inflammatory change.  Past Medical History:  Diagnosis Date  . High cholesterol   . Hypothyroidism   . Rheumatoid arthritis [facility])     Past Surgical History:  Procedure Laterality Date  . SHOULDER SURGERY Right     Prior to Admission medications   Medication Sig Start Date End Date Taking? Authorizing Provider  aspirin EC 81 MG tablet Take 81 mg by mouth daily. Swallow whole.   Yes [provider]  folic acid (FOLVITE) 1 MG tablet Take 1 tablet (1 mg total) by mouth daily. 01/16/20  Yes Deveshwar, Abel Presto, MD  levothyroxine (SYNTHROID, LEVOTHROID) 75 MCG tablet Take 75 mcg by mouth daily before breakfast.  06/30/18  Yes [provider]  methotrexate (RHEUMATREX) 2.5 MG tablet Take 3 tablets (7.5 mg total) by mouth once a week. Caution:Chemotherapy. Protect from light. 01/16/20  Yes Deveshwar, Abel Presto, MD  simvastatin (ZOCOR) 40 MG tablet Take 40 mg by mouth daily.  06/30/18  Yes [provider]  predniSONE (DELTASONE) 5 MG tablet Take 4 tablets by mouth daily x4 days, 3 tablets by mouth daily x4 days, 2 tablets by mouth daily x4 days, 1 tablet by mouth daily x4 days. 01/15/20   Yopp, Amber C, RPH-CPP    Current Facility-Administered Medications  Medication Dose Route Frequency Provider Last Rate Last Admin  . acetaminophen (TYLENOL) tablet 650 mg  650 mg Oral Q6H PRN Maczis, Barth Kirks, PA-C      . dextrose 5 %-0.9 % sodium chloride infusion   Intravenous Continuous Rise Patience, MD 75 mL/hr at 03/12/20 0600 Rate Verify at 03/12/20 0600  . levothyroxine (SYNTHROID, LEVOTHROID) injection 37.5 mcg  37.5 mcg Intravenous Daily Rise Patience, MD      . LORazepam (ATIVAN) injection 1 mg  1 mg Intravenous Once PRN Jillyn Ledger, PA-C      . morphine 2 MG/ML  injection 1 mg  1 mg Intravenous Q4H PRN Rise Patience, MD      . ondansetron [facility]) tablet 4 mg  4 mg Oral Q6H PRN Rise Patience, MD       Or  . ondansetron [facility]) injection 4 mg  4 mg Intravenous Q6H PRN Rise Patience, MD      . oxyCODONE (Oxy IR/ROXICODONE) immediate release tablet 5-10 mg  5-10 mg Oral Q4H PRN Maczis, Barth Kirks, PA-C      . piperacillin-tazobactam (ZOSYN) IVPB 3.375 g  3.375 g Intravenous Q8H Rise Patience, MD 12.5 mL/hr at 03/12/20 0600 Rate Verify at 03/12/20 0600    Allergies as of 03/11/2020  . (No Known Allergies)    Family History  Problem Relation Age of Onset  . Heart attack Father   . Hypertension Father   . Diabetes Brother   . Diabetes Sister   . Diabetes Sister   . Heart attack Son   .  Healthy Son   . Healthy Daughter     Social History   Socioeconomic History  . Marital status: Married    Spouse name: Not on file  . Number of children: Not on file  . Years of education: Not on file  . Highest education level: Not on file  Occupational History  . Not on file  Tobacco Use  . Smoking status: Former Smoker    Packs/day: 1.00    Years: 43.00    Pack years: 43.00    Types: Cigarettes    Quit date: 11/29/1998    Years since quitting: 21.2  . Smokeless tobacco: Never Used  Vaping Use  . Vaping Use: Never used  Substance and Sexual Activity  . Alcohol use: Not Currently  . Drug use: Not Currently  . Sexual activity: Not on file  Other Topics Concern  . Not on file  Social History Narrative  . Not on file   Social Determinants of Health   Financial Resource Strain:   . Difficulty of Paying Living Expenses:   Food Insecurity:   . Worried About Charity fundraiser in the Last Year:   . Arboriculturist in the Last Year:   Transportation Needs:   . Film/video editor (Medical):   Marland Kitchen Lack of Transportation (Non-Medical):   Physical Activity:   . Days of Exercise per Week:   . Minutes of Exercise per  Session:   Stress:   . Feeling of Stress :   Social Connections:   . Frequency of Communication with Friends and Family:   . Frequency of Social Gatherings with Friends and Family:   . Attends Religious Services:   . Active Member of Clubs or Organizations:   . Attends Archivist Meetings:   Marland Kitchen Marital Status:   Intimate Partner Violence:   . Fear of Current or Ex-Partner:   . Emotionally Abused:   Marland Kitchen Physically Abused:   . Sexually Abused:     Review of Systems:  See HPI, all other systems reviewed and are negative   Physical Exam: Vital signs in last 24 hours: Temp:  [97.8 F (36.6 C)-99 F (37.2 C)] 97.8 F (36.6 C) (06/16 0440) Pulse Rate:  [52-94] 52 (06/16 0440) Resp:  [16-24] 16 (06/16 0440) BP: (110-146)/(66-108) 139/87 (06/16 0440) SpO2:  [91 %-98 %] 96 % (06/16 0440) Weight:  [78 kg] 78 kg (06/15 1922) Last BM Date: 03/11/20   General:  Alert,  well-developed, well-nourished, pleasant and cooperative in NAD. Head:  Normocephalic and atraumatic. Eyes:  Mild scleral icterus. Conjunctiva pink. Ears:  Normal auditory acuity. Nose:  No deformity, discharge or lesions. Mouth:  Upper dentures, partial lower dentures. No ulcers or lesions.  Neck:  Supple. No lymphadenopathy or thyromegaly.  Lungs: Breath sounds clear throughout.  Heart:  RRR, no murmur.  Abdomen:  Moderate RUQ and epigastric tenderness, RUQ appears slightly swollen/disteneded. No rebound or guarding. Hypoactive BS x 4 quads. No HSM. Rectal: Deferred. Musculoskeletal:  Symmetrical without gross deformities.  Pulses:  Normal pulses noted. Extremities:  Without clubbing or edema. Neurologic:  Alert and  oriented x4. No focal deficits.  Skin:  Intact without significant lesions or rashes. Psych:  Alert and cooperative. Normal mood and affect.  Intake/Output from previous day: 06/15 0701 - 06/16 0700 In: 1367.1 [I.V.:289.3; IV Piggyback:1077.8] Out: -  Intake/Output this shift: No  intake/output data recorded.  Lab Results: Recent Labs    03/11/20 2022 03/12/20 0437  WBC 11.5*  8.8  HGB 13.8 13.0  HCT 42.1 39.3  PLT 179 163   BMET Recent Labs    03/11/20 2022 03/12/20 0437  NA 135 135  K 3.8 4.1  CL 100 103  CO2 25 25  GLUCOSE 119* 123*  BUN 19 17  CREATININE 1.44* 1.30*  CALCIUM 8.8* 8.6*   LFT Recent Labs    03/12/20 0437  PROT 6.9  ALBUMIN 3.4*  AST 19  ALT 13  ALKPHOS 80  BILITOT 3.1*  BILIDIR 0.4*  IBILI 2.7*   PT/INR No results for input(s): LABPROT, INR in the last 72 hours. Hepatitis Panel No results for input(s): HEPBSAG, HCVAB, HEPAIGM, HEPBIGM in the last 72 hours.    Studies/Results: CT ABDOMEN PELVIS W CONTRAST  Result Date: 03/11/2020 CLINICAL DATA:  Right upper quadrant and epigastric pain EXAM: CT ABDOMEN AND PELVIS WITH CONTRAST TECHNIQUE: Multidetector CT imaging of the abdomen and pelvis was performed using the standard protocol following bolus administration of intravenous contrast. CONTRAST:  68m OMNIPAQUE IOHEXOL 300 MG/ML  SOLN COMPARISON:  Radiograph 03/11/2020 FINDINGS: Lower chest: No acute abnormality. Hepatobiliary: Subcentimeter hypodensity in the right hepatic lobe too small to further characterize. Dilated gallbladder with wall thickening and/or pericholecystic fluid. Mild soft tissue stranding in the right upper quadrant consistent with edema. Probable small calcified stones best seen on coronal views. Mildly enhancing ductal walls on the coronal views. Common bile duct upper normal at 7 mm. Possible common duct stone, series 2, image number 34, coronal series 5, image number 43. Pancreas: Unremarkable. No pancreatic ductal dilatation or surrounding inflammatory changes. Spleen: Normal in size without focal abnormality. Adrenals/Urinary Tract: Adrenal glands are normal. Cyst in the mid right kidney. No hydronephrosis. Bladder is normal Stomach/Bowel: Stomach is within normal limits. Appendix appears normal.  Sigmoid colon diverticular disease without acute inflammatory change. No evidence of bowel wall thickening, distention, or inflammatory changes. Vascular/Lymphatic: Moderate aortic atherosclerosis. 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 No suspicious nodes Reproductive: Enlarged prostate Other: No free air or free fluid. Musculoskeletal: Degenerative changes of the spine. No acute or suspicious osseous abnormality IMPRESSION: 1. Dilated gallbladder with suspected punctate stones. There is gallbladder wall thickening or pericholecystic fluid with soft tissue inflammatory process in the right upper quadrant, constellation of findings is concerning for an acute cholecystitis. Borderline dilatation of common bile duct with possible small filling defect or stone in the distal duct, correlate with LFTs. Follow-up MRCP as indicated. 2. 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 Recommend followup by ultrasound in 2 years. This recommendation follows ACR consensus guidelines: White Paper of the ACR Incidental Findings Committee II on Vascular Findings. J Am Coll Radiol 2013; 10:789-794. Aortic aneurysm NOS (ICD10-I71.9) 3. Sigmoid colon diverticular disease without acute inflammatory change. Electronically Signed   By: KDonavan FoilM.D.   On: 03/11/2020 22:56   DG Abdomen Acute W/Chest  Result Date: 03/11/2020 CLINICAL DATA:  Epigastric pain, distension, nausea EXAM: DG ABDOMEN ACUTE W/ 1V CHEST COMPARISON:  None. FINDINGS: Supine and upright frontal views of the abdomen as well as an upright frontal view of the chest are obtained. The cardiac silhouette is unremarkable. No airspace disease, effusion, or pneumothorax. Bowel gas pattern is unremarkable. No masses or abnormal calcifications. No free gas in the greater peritoneal sac. IMPRESSION: 1. Unremarkable abdominal series. Electronically Signed   By: MRanda NgoM.D.   On: 03/11/2020 20:44     IMPRESSION/PLAN:  176 79year old male with  generalized weakness, nausea, epigastric and right upper quadrant abdominal pain. Lipase 20. Alk phos 86. AST 21. ALT 14. T. Bili 2.7.  -> 3.1 with Indirect bili 2.7. Alk phos 86.  Abdominal/pelvic CT 6/15 identified a dilated gallbladder with suspected punctate stones, gallbladder wall thickening with soft tissue inflammation to the right upper quadrant discerning for acute cholecystitis.  Borderline dilatation of the common bile duct concerning for a small filling defect or distal duct stone.  He has been seen by general surgery, most likely will  need a laparoscopic cholecystectomy during this hospital admission. He is afebrile. -Await abdominal MRI and MRCP results, if common bile duct stone identified patient will require ERCP -NPO -Zofran 39m IV Q 6 hrs PRN -IV fluids per the hospitalist -Agree with Zosyn 3.375 mg IV every 8 hours -Repeat CBC, BMP and hepatic panel in am  2. 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 History of rheumatoid arthritis methotrexate  4. Near syncope episode with dizziness on 6/15. EKG 6/16 NSR with flat T waves V5-V6. No acute ischemia.  -Consider cardiac evaluation during hospital admission  5. AKI most likely from dehydration, improving   Further recommendations per Dr. CLorenda CahillMDorathy Daft 03/12/2020, 11:24 AM

## 2020-03-12 NOTE — Progress Notes (Signed)
Pharmacy Antibiotic Note  Larry Salazar is a 79 y.o. male admitted on 03/11/2020 with Intra-abdominal infection.  Pharmacy has been consulted for zosyn dosing.  Plan: Zosyn 3.375g IV q8h (4 hour infusion).  Height: 5\' 5"  (165.1 cm) Weight: 78 kg (172 lb) IBW/kg (Calculated) : 61.5  Temp (24hrs), Avg:98.4 F (36.9 C), Min:97.8 F (36.6 C), Max:99 F (37.2 C)  Recent Labs  Lab 03/11/20 2022 03/12/20 0437  WBC 11.5* 8.8  CREATININE 1.44*  --     Estimated Creatinine Clearance: 40.7 mL/min (A) (by C-G formula based on SCr of 1.44 mg/dL (H)).    No Known Allergies  Antimicrobials this admission: Zosyn 03/11/2020 >>    Dose adjustments this admission: -  Microbiology results: -  Thank you for allowing pharmacy to be a part of this patient's care.  03/13/2020 [place] 03/12/2020 4:51 AM

## 2020-03-13 DIAGNOSIS — K8050 Calculus of bile duct without cholangitis or cholecystitis without obstruction: Secondary | ICD-10-CM

## 2020-03-13 DIAGNOSIS — M0609 Rheumatoid arthritis without rheumatoid factor, multiple sites: Secondary | ICD-10-CM

## 2020-03-13 LAB — CBC
HCT: 38 % — ABNORMAL LOW (ref 39.0–52.0)
Hemoglobin: 12.5 g/dL — ABNORMAL LOW (ref 13.0–17.0)
MCH: 32.1 pg (ref 26.0–34.0)
MCHC: 32.9 g/dL (ref 30.0–36.0)
MCV: 97.4 fL (ref 80.0–100.0)
Platelets: 166 10*3/uL (ref 150–400)
RBC: 3.9 MIL/uL — ABNORMAL LOW (ref 4.22–5.81)
RDW: 12.4 % (ref 11.5–15.5)
WBC: 7 10*3/uL (ref 4.0–10.5)
nRBC: 0 % (ref 0.0–0.2)

## 2020-03-13 LAB — COMPREHENSIVE METABOLIC PANEL
ALT: 13 U/L (ref 0–44)
AST: 25 U/L (ref 15–41)
Albumin: 3.2 g/dL — ABNORMAL LOW (ref 3.5–5.0)
Alkaline Phosphatase: 89 U/L (ref 38–126)
Anion gap: 9 (ref 5–15)
BUN: 15 mg/dL (ref 8–23)
CO2: 23 mmol/L (ref 22–32)
Calcium: 8.1 mg/dL — ABNORMAL LOW (ref 8.9–10.3)
Chloride: 104 mmol/L (ref 98–111)
Creatinine, Ser: 1.34 mg/dL — ABNORMAL HIGH (ref 0.61–1.24)
GFR calc Af Amer: 58 mL/min — ABNORMAL LOW (ref >60)
GFR calc non Af Amer: 50 mL/min — ABNORMAL LOW (ref >60)
Glucose, Bld: 118 mg/dL — ABNORMAL HIGH (ref 70–99)
Potassium: 3.3 mmol/L — ABNORMAL LOW (ref 3.5–5.1)
Sodium: 136 mmol/L (ref 135–145)
Total Bilirubin: 2.4 mg/dL — ABNORMAL HIGH (ref 0.3–1.2)
Total Protein: 6.4 g/dL — ABNORMAL LOW (ref 6.5–8.1)

## 2020-03-13 LAB — MAGNESIUM: Magnesium: 2.1 mg/dL (ref 1.7–2.4)

## 2020-03-13 LAB — BILIRUBIN, DIRECT: Bilirubin, Direct: 0.4 mg/dL — ABNORMAL HIGH (ref 0.0–0.2)

## 2020-03-13 MED ORDER — SIMVASTATIN 40 MG PO TABS
40.0000 mg | ORAL_TABLET | Freq: Every day | ORAL | Status: DC
  Administered 2020-03-13 – 2020-03-16 (×4): 40 mg via ORAL
  Filled 2020-03-13 (×4): qty 1

## 2020-03-13 MED ORDER — DEXTROSE IN LACTATED RINGERS 5 % IV SOLN
INTRAVENOUS | Status: DC
  Filled 2020-03-13: qty 1000

## 2020-03-13 MED ORDER — POTASSIUM CHLORIDE CRYS ER 20 MEQ PO TBCR
40.0000 meq | EXTENDED_RELEASE_TABLET | ORAL | Status: AC
  Administered 2020-03-13 (×2): 40 meq via ORAL
  Filled 2020-03-13 (×2): qty 2

## 2020-03-13 MED ORDER — LEVOTHYROXINE SODIUM 75 MCG PO TABS
75.0000 ug | ORAL_TABLET | Freq: Every day | ORAL | Status: DC
  Administered 2020-03-14 – 2020-03-17 (×3): 75 ug via ORAL
  Filled 2020-03-13 (×3): qty 1

## 2020-03-13 NOTE — Progress Notes (Signed)
PROGRESS NOTE    Larry Salazar  MPN:[id number] DOB: 1941/04/07 DOA: 03/11/2020 PCP: Heron Nay, PA    Brief Narrative:  Patient admitted to the hospital with working diagnosis of acute cholecystitis.  79 year old male who presented with weakness and abdominal pain.  He does have significant past medical history for rheumatoid arthritis, hypothyroidism, and chronic kidney disease stage III.  Patient reported abdominal discomfort for about 2 weeks, mainly in the epigastric area, with no nausea, vomiting or diarrhea.  On the day of admission patient fell onto the floor after feeling dizzy and very weak well getting out of his car.  On his initial physical examination blood pressure 128/77, heart rate 63, respirate 23, oxygen saturation 97%, temperature 98.4, his lungs are clear to auscultation bilaterally, heart S1-S2, present rhythmic, the abdomen was tender in the right upper quadrant, no rigidity or rebound, lower extremity edema. CT of the abdomen had dilated gallbladder with suspected punctate stones.  Gallbladder wall thickening and pericholecystic fluid, soft tissue inflammatory process, consistent with acute cholecystitis.  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  He has been placed on intravenous fluids, IV antibiotics and analgesics.  Further work-up with MRCP showed cholelithiasis with suspicion of acute cholecystitis and possible cystic duct stone.  Surgery and gastroenterology have been consulted.  Patient will undergo ERCP 03/14/20.   Assessment & Plan:   Principal Problem:   Acute cholecystitis Active Problems:   Rheumatoid arthritis, seronegative, multiple sites [facility])   History of hypothyroidism   RUQ pain   Abdominal pain, epigastric   1. Acute cholecystitis complicated with cystic duct obstructing stone. Patient continue to have abdominal pain, controlled with analgesics. No nausea or vomiting, tolerating clear liquids. Wbc is 8.8. Total  bilirubin is 2,4 with AST 25 and ALT 13.  Continue with IV antibiotic therapy with IV Zosyn, IV fluids and as needed IV morphine/ oxycodone and zofran.   Follow up complete metabolic panel in am.   2. AKI on CKD stage 3a (base cr is 1,2). Hypokalemia. Will continue hydration with balanced electrolyte solutions with dextrose at 75 ml per H, will add 40 kcl x2 and follow renal panel in am. Avoid hypotension and nephrotoxic medications.  3. Hypothyroid. Continue levothyroxine.   4. RA. Holding methotrexate for now due to acute infection   Status is: Inpatient  Remains inpatient appropriate because:IV treatments appropriate due to intensity of illness or inability to take PO   Dispo: The patient is from: Home              Anticipated d/c is to: Home              Anticipated d/c date is: 3 days              Patient currently is not medically stable to d/c.   DVT prophylaxis: Enoxaparin   Code Status:   full  Family Communication:  I spoke with patient's wife at the bedside, we talked in detail about patient's condition, plan of care and prognosis and all questions were addressed.     Consultants:   Surgery   GI    Antimicrobials:   IV zosyn.     Subjective: Patient continue to have abdominal discomfort, more epigastric in location, and dull in nature, moderate in intensity, with no associated nausea or vomiting.   Objective: Vitals:   03/12/20 0440 03/12/20 1837 03/12/20 2034 03/13/20 0458  BP: 139/87 119/75 119/78 130/73  Pulse: (!) 52 (!) 51 (!) 57  62  Resp: 16 18 15 15   Temp: 97.8 F (36.6 C) 97.9 F (36.6 C) 98.5 F (36.9 C) 98 F (36.7 C)  TempSrc: Oral  Oral Oral  SpO2: 96% 96% 97% 95%  Weight:      Height:        Intake/Output Summary (Last 24 hours) at 03/13/2020 1337 Last data filed at 03/13/2020 1000 Gross per 24 hour  Intake 1716.55 ml  Output 302 ml  Net 1414.55 ml   Filed Weights   03/11/20 1922  Weight: 78 kg    Examination:   General:  Not in pain or dyspnea, deconditioned  Neurology: Awake and alert, non focal  E ENT: mild pallor, mild icterus, oral mucosa moist Cardiovascular: No JVD. S1-S2 present, rhythmic, no gallops, rubs, or murmurs. No lower extremity edema. Pulmonary: positive breath sounds bilaterally, adequate air movement, no wheezing, rhonchi or rales. Gastrointestinal. Abdomen soft with  no organomegaly, no rebound or guarding. Mild distended, and non tender to superficial palpation.  Skin. No rashes Musculoskeletal: no joint deformities     Data Reviewed: I have personally reviewed following labs and imaging studies  CBC: Recent Labs  Lab 03/11/20 2022 03/12/20 0437 03/13/20 0319  WBC 11.5* 8.8 7.0  NEUTROABS 8.2* 5.5  --   HGB 13.8 13.0 12.5*  HCT 42.1 39.3 38.0*  MCV 96.8 97.8 97.4  PLT 179 163 371   Basic Metabolic Panel: Recent Labs  Lab 03/11/20 2022 03/12/20 0437 03/13/20 0319  NA 135 135 136  K 3.8 4.1 3.3*  CL 100 103 104  CO2 25 25 23   GLUCOSE 119* 123* 118*  BUN 19 17 15   CREATININE 1.44* 1.30* 1.34*  CALCIUM 8.8* 8.6* 8.1*  MG  --   --  2.1   GFR: Estimated Creatinine Clearance: 43.8 mL/min (A) (by C-G formula based on SCr of 1.34 mg/dL (H)). Liver Function Tests: Recent Labs  Lab 03/11/20 2022 03/12/20 0437 03/13/20 0319  AST 21 19 25   ALT 14 13 13   ALKPHOS 86 80 89  BILITOT 2.7* 3.1* 2.4*  PROT 7.4 6.9 6.4*  ALBUMIN 3.8 3.4* 3.2*   Recent Labs  Lab 03/11/20 2022  LIPASE 20   No results for input(s): AMMONIA in the last 168 hours. Coagulation Profile: No results for input(s): INR, PROTIME in the last 168 hours. Cardiac Enzymes: No results for input(s): CKTOTAL, CKMB, CKMBINDEX, TROPONINI in the last 168 hours. BNP (last 3 results) No results for input(s): PROBNP in the last 8760 hours. HbA1C: No results for input(s): HGBA1C in the last 72 hours. CBG: No results for input(s): GLUCAP in the last 168 hours. Lipid Profile: No results for input(s):  CHOL, HDL, LDLCALC, TRIG, CHOLHDL, LDLDIRECT in the last 72 hours. Thyroid Function Tests: No results for input(s): TSH, T4TOTAL, FREET4, T3FREE, THYROIDAB in the last 72 hours. Anemia Panel: No results for input(s): VITAMINB12, FOLATE, FERRITIN, TIBC, IRON, RETICCTPCT in the last 72 hours.    Radiology Studies: I have reviewed all of the imaging during this hospital visit personally     Scheduled Meds: . levothyroxine  37.5 mcg Intravenous Daily   Continuous Infusions: . piperacillin-tazobactam (ZOSYN)  IV 3.375 g (03/13/20 0622)     LOS: 1 day        Aleena Kirkeby Gerome Apley, MD

## 2020-03-13 NOTE — Consult Note (Signed)
[facility] Progress Note  CC:  Choledocholithiasis, RUQ pain   Subjective:  He reports feeling well today. No N/V. He has epigastric pain only when he changes position in bed. He passed 2 soft Bms this morning, he doesn't look at the stool, no obviously blood. No difficulties urinating. His wife is at the bed side.    Objective:   Abdominal MRI/MRCP 6/117/2021:  1. Moderately motion degraded exam. 2. Cholelithiasis with suspicion of acute cholecystitis and possible cystic duct stone. 3. Isolated choledocholithiasis, as on CT. 4. At least 1 cystic focus within the pancreas. 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 Per consensus criteria, this warrants follow-up pre and post contrast abdominal MRI/MRCP at 2 years. This recommendation follows ACR consensus guidelines: Management of Incidental Pancreatic Cysts: A White Paper of the ACR Incidental Findings Committee. J Am Coll Radiol 2017;14:911-923. 5.  Aortic Atherosclerosis   Vital signs in last 24 hours: Temp:  [97.9 F (36.6 C)-98.5 F (36.9 C)] 98 F (36.7 C) (06/17 0458) Pulse Rate:  [51-62] 62 (06/17 0458) Resp:  [15-18] 15 (06/17 0458) BP: (119-130)/(73-78) 130/73 (06/17 0458) SpO2:  [95 %-97 %] 95 % (06/17 0458) Last BM Date: 03/11/20 General:   Alert,  Well-developed, in NAD Heart: RRR, no murmur.  Pulm:  Breath sounds clear throughout.  Abdomen: Soft, epigastric tenderness, RUQ protuberance with swelling, no significant change when compared to yesterday's exam. + BS x 4 quadrants.  Extremities:  Without edema. Neurologic:  Alert and  oriented x4;  grossly normal neurologically. Psych:  Alert and cooperative. Normal mood and affect.  Intake/Output from previous day: 06/16 0701 - 06/17 0700 In: 1671.9 [P.O.:120; I.V.:1352.3; IV Piggyback:199.6] Out: 302 [Urine:302] Intake/Output this shift: Total I/O In: 44.7 [IV Piggyback:44.7] Out: -   Lab Results: Recent Labs    03/11/20 2022 03/12/20 0437  03/13/20 0319  WBC 11.5* 8.8 7.0  HGB 13.8 13.0 12.5*  HCT 42.1 39.3 38.0*  PLT 179 163 166   BMET Recent Labs    03/11/20 2022 03/12/20 0437 03/13/20 0319  NA 135 135 136  K 3.8 4.1 3.3*  CL 100 103 104  CO2 25 25 23   GLUCOSE 119* 123* 118*  BUN 19 17 15   CREATININE 1.44* 1.30* 1.34*  CALCIUM 8.8* 8.6* 8.1*   LFT Recent Labs    03/12/20 0437 03/12/20 0437 03/13/20 0319  PROT 6.9   < > 6.4*  ALBUMIN 3.4*   < > 3.2*  AST 19   < > 25  ALT 13   < > 13  ALKPHOS 80   < > 89  BILITOT 3.1*   < > 2.4*  BILIDIR 0.4*   < > 0.4*  IBILI 2.7*  --   --    < > = values in this interval not displayed.   PT/INR No results for input(s): LABPROT, INR in the last 72 hours. Hepatitis Panel No results for input(s): HEPBSAG, HCVAB, HEPAIGM, HEPBIGM in the last 72 hours.  CT ABDOMEN PELVIS W CONTRAST  Result Date: 03/11/2020 CLINICAL DATA:  Right upper quadrant and epigastric pain EXAM: CT ABDOMEN AND PELVIS WITH CONTRAST TECHNIQUE: Multidetector CT imaging of the abdomen and pelvis was performed using the standard protocol following bolus administration of intravenous contrast. CONTRAST:  39m OMNIPAQUE IOHEXOL 300 MG/ML  SOLN COMPARISON:  Radiograph 03/11/2020 FINDINGS: Lower chest: No acute abnormality. Hepatobiliary: Subcentimeter hypodensity in the right hepatic lobe too small to further characterize. Dilated gallbladder with wall thickening and/or pericholecystic fluid. Mild soft tissue  stranding in the right upper quadrant consistent with edema. Probable small calcified stones best seen on coronal views. Mildly enhancing ductal walls on the coronal views. Common bile duct upper normal at 7 mm. Possible common duct stone, series 2, image number 34, coronal series 5, image number 43. Pancreas: Unremarkable. No pancreatic ductal dilatation or surrounding inflammatory changes. Spleen: Normal in size without focal abnormality. Adrenals/Urinary Tract: Adrenal glands are normal. Cyst in the mid  right kidney. No hydronephrosis. Bladder is normal Stomach/Bowel: Stomach is within normal limits. Appendix appears normal. Sigmoid colon diverticular disease without acute inflammatory change. No evidence of bowel wall thickening, distention, or inflammatory changes. Vascular/Lymphatic: Moderate aortic atherosclerosis. 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 No suspicious nodes Reproductive: Enlarged prostate Other: No free air or free fluid. Musculoskeletal: Degenerative changes of the spine. No acute or suspicious osseous abnormality IMPRESSION: 1. Dilated gallbladder with suspected punctate stones. There is gallbladder wall thickening or pericholecystic fluid with soft tissue inflammatory process in the right upper quadrant, constellation of findings is concerning for an acute cholecystitis. Borderline dilatation of common bile duct with possible small filling defect or stone in the distal duct, correlate with LFTs. Follow-up MRCP as indicated. 2. 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 Recommend followup by ultrasound in 2 years. This recommendation follows ACR consensus guidelines: White Paper of the ACR Incidental Findings Committee II on Vascular Findings. J Am Coll Radiol 2013; 10:789-794. Aortic aneurysm NOS (ICD10-I71.9) 3. Sigmoid colon diverticular disease without acute inflammatory change. Electronically Signed   By: Donavan Foil M.D.   On: 03/11/2020 22:56   MR 3D Recon At Scanner  Result Date: 03/12/2020 CLINICAL DATA:  Cholelithiasis. Rule out common bile duct stone. Right upper quadrant and epigastric pain. EXAM: MRI ABDOMEN WITHOUT AND WITH CONTRAST (INCLUDING MRCP) TECHNIQUE: Multiplanar multisequence MR imaging of the abdomen was performed both before and after the administration of intravenous contrast. Heavily T2-weighted images of the biliary and pancreatic ducts were obtained, and three-dimensional MRCP images were  rendered by post processing. CONTRAST:  107m GADAVIST GADOBUTROL 1 MMOL/ML IV SOLN COMPARISON:  03/11/2020 CT. FINDINGS: Portions of exam are moderately motion degraded. Lower chest: Mild cardiomegaly, without pericardial or pleural effusion. Hepatobiliary: No suspicious liver lesion. Tiny gallstone on 22/3. Gallbladder wall thickening is similar, including at 5 mm. Mild pericholecystic edema, including on 18/3. T2 hypointensity of 4 mm on 17/3 is suspected to be in the cystic duct. Possibly correlated on 23/16. Borderline intrahepatic biliary duct dilatation. The common duct measures 7 mm, which is normal for age. Corresponding to the CT abnormality, within the distal common duct, is a T2 hypointense focus on the order of 3 mm on 13/11 and 30/16. Pancreas: No evidence of acute pancreatitis. At least 1 cystic focus within the pancreas including at 4 mm within the tail on 16/3. No main pancreatic duct dilatation. Spleen:  Normal in size, without focal abnormality. Adrenals/Urinary Tract: Normal adrenal glands. Interpolar dominant right renal 5.2 cm cyst. Normal left kidney. Stomach/Bowel: Normal stomach and abdominal bowel loops. Vascular/Lymphatic: Aortic atherosclerosis. Nonaneurysmal dilatation of the infrarenal aorta at 2.6 cm. No retroperitoneal or retrocrural adenopathy. Other:  Trace ascites in the right pericolic gutter. Musculoskeletal: No acute osseous abnormality. IMPRESSION: 1. Moderately motion degraded exam. 2. Cholelithiasis with suspicion of acute cholecystitis and possible cystic duct stone. 3. Isolated choledocholithiasis, as on CT. 4. At least 1 cystic focus within the pancreas. 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 Per consensus criteria,  this warrants follow-up pre and post contrast abdominal MRI/MRCP at 2 years. This recommendation follows ACR consensus guidelines: Management of Incidental Pancreatic Cysts: A White Paper of the ACR Incidental Findings Committee. J Am Coll Radiol 7673;41:937-902. 5.  Aortic  Atherosclerosis (ICD10-I70.0). Electronically Signed   By: Abigail Miyamoto M.D.   On: 03/12/2020 15:39   DG Abdomen Acute W/Chest  Result Date: 03/11/2020 CLINICAL DATA:  Epigastric pain, distension, nausea EXAM: DG ABDOMEN ACUTE W/ 1V CHEST COMPARISON:  None. FINDINGS: Supine and upright frontal views of the abdomen as well as an upright frontal view of the chest are obtained. The cardiac silhouette is unremarkable. No airspace disease, effusion, or pneumothorax. Bowel gas pattern is unremarkable. No masses or abnormal calcifications. No free gas in the greater peritoneal sac. IMPRESSION: 1. Unremarkable abdominal series. Electronically Signed   By: Randa Ngo M.D.   On: 03/11/2020 20:44   MR ABDOMEN MRCP W WO CONTAST  Result Date: 03/12/2020 CLINICAL DATA:  Cholelithiasis. Rule out common bile duct stone. Right upper quadrant and epigastric pain. EXAM: MRI ABDOMEN WITHOUT AND WITH CONTRAST (INCLUDING MRCP) TECHNIQUE: Multiplanar multisequence MR imaging of the abdomen was performed both before and after the administration of intravenous contrast. Heavily T2-weighted images of the biliary and pancreatic ducts were obtained, and three-dimensional MRCP images were rendered by post processing. CONTRAST:  59m GADAVIST GADOBUTROL 1 MMOL/ML IV SOLN COMPARISON:  03/11/2020 CT. FINDINGS: Portions of exam are moderately motion degraded. Lower chest: Mild cardiomegaly, without pericardial or pleural effusion. Hepatobiliary: No suspicious liver lesion. Tiny gallstone on 22/3. Gallbladder wall thickening is similar, including at 5 mm. Mild pericholecystic edema, including on 18/3. T2 hypointensity of 4 mm on 17/3 is suspected to be in the cystic duct. Possibly correlated on 23/16. Borderline intrahepatic biliary duct dilatation. The common duct measures 7 mm, which is normal for age. Corresponding to the CT abnormality, within the distal common duct, is a T2 hypointense focus on the order of 3 mm on 13/11 and 30/16.  Pancreas: No evidence of acute pancreatitis. At least 1 cystic focus within the pancreas including at 4 mm within the tail on 16/3. No main pancreatic duct dilatation. Spleen:  Normal in size, without focal abnormality. Adrenals/Urinary Tract: Normal adrenal glands. Interpolar dominant right renal 5.2 cm cyst. Normal left kidney. Stomach/Bowel: Normal stomach and abdominal bowel loops. Vascular/Lymphatic: Aortic atherosclerosis. Nonaneurysmal dilatation of the infrarenal aorta at 2.6 cm. No retroperitoneal or retrocrural adenopathy. Other:  Trace ascites in the right pericolic gutter. Musculoskeletal: No acute osseous abnormality. IMPRESSION: 1. Moderately motion degraded exam. 2. Cholelithiasis with suspicion of acute cholecystitis and possible cystic duct stone. 3. Isolated choledocholithiasis, as on CT. 4. At least 1 cystic focus within the pancreas. 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 Per consensus criteria, this warrants follow-up pre and post contrast abdominal MRI/MRCP at 2 years. This recommendation follows ACR consensus guidelines: Management of Incidental Pancreatic Cysts: A White Paper of the ACR Incidental Findings Committee. J Am Coll Radiol 24097;35:329-924 5.  Aortic Atherosclerosis (ICD10-I70.0). Electronically Signed   By: KAbigail MiyamotoM.D.   On: 03/12/2020 15:39    Assessment / Plan:  [age]year old male with generalized weakness, nausea, epigastric and right upper quadrant abdominal pain. Normal Lipase and LFTs. Today AST 25. ALT 13. T. Bili 2.4. Direct bili 0.4. Alk phos 89.  Abdominal/pelvic CT 6/15 identified a dilated gallbladder with suspected punctate stones, gallbladder wall thickening with soft tissue inflammation to the right upper quadrant discerning for acute cholecystitis.  Borderline dilatation of  the common bile duct concerning for a small filling defect or distal duct stone.  Abdominal MRI/MRCP 6/16 showed gallstones, possibly cystic duct stone, acute cholecystitis, isolated  choledocholithiasis and a 1 cm cystic focus to the tail of the pancreas. ERCP scheduled for 6/18. General surgery following, most likely will undergo a laparoscopic cholecystectomy during this hospital admission. WBC 7.0. He remains afebrile.  -Zofran 28m IV Q 6 hrs PRN -IV fluids and pain management  per the hospitalist -Continue  Zosyn 3.375 mg IV every 8 hours -Repeat CBC, BMP and hepatic panel in am -NPO after midnight for ERCP in 6/18 -ERCP procedure benefits and risks discussed including risk with general anesthesia, risk of bleeding, perforation, pancreatitis  and infection discussed with the patient and his wife  2. 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 History of rheumatoid arthritis methotrexate  4. AKI. 1.30 -> 1.34  5. Short term memory deficit.   Further recommendations per Dr. CBryan Lemma    Principal Problem:   Acute cholecystitis Active Problems:   Rheumatoid arthritis, seronegative, multiple sites ([facility]   History of hypothyroidism   RUQ pain   Abdominal pain, epigastric     LOS: 1 day   CNoralyn Pick 03/13/2020, 2:03 PM

## 2020-03-13 NOTE — Plan of Care (Signed)

## 2020-03-13 NOTE — Progress Notes (Signed)
[facility] Progress Note     Subjective: Patient frustrated that no one has explained to him what is going on yet. He has some epigastric abdominal pain. Denies nausea. Discussed plan for ERCP when GI is able to be followed by cholecystectomy.   Objective: Vital signs in last 24 hours: Temp:  [97.9 F (36.6 C)-98.5 F (36.9 C)] 98 F (36.7 C) (06/17 0458) Pulse Rate:  [51-62] 62 (06/17 0458) Resp:  [15-18] 15 (06/17 0458) BP: (119-130)/(73-78) 130/73 (06/17 0458) SpO2:  [95 %-97 %] 95 % (06/17 0458) Last BM Date: 03/11/20  Intake/Output from previous day: 06/16 0701 - 06/17 0700 In: 1671.9 [P.O.:120; I.V.:1352.3; IV Piggyback:199.6] Out: 302 [Urine:302] Intake/Output this shift: No intake/output data recorded.  PE: General: pleasant, WD, WN white male who is laying in bed in NAD HEENT:  Sclera are anicteric.  PERRL.  Ears and nose without any masses or lesions.  Mouth is pink and moist Heart: regular, rate, and rhythm. Palpable radial and pedal pulses bilaterally Lungs:  Respiratory effort nonlabored Abd: soft, ttp in RUQ and epidgastrium, ND, no masses, hernias, or organomegaly MS: all 4 extremities are symmetrical with no cyanosis, clubbing, or edema. Skin: warm and dry with no masses, lesions, or rashes Neuro: Cranial nerves 2-12 grossly intact, sensation grossly intact throughout Psych: A&Ox3 with an appropriate affect.   Lab Results:  Recent Labs    03/12/20 0437 03/13/20 0319  WBC 8.8 7.0  HGB 13.0 12.5*  HCT 39.3 38.0*  PLT 163 166   BMET Recent Labs    03/12/20 0437 03/13/20 0319  NA 135 136  K 4.1 3.3*  CL 103 104  CO2 25 23  GLUCOSE 123* 118*  BUN 17 15  CREATININE 1.30* 1.34*  CALCIUM 8.6* 8.1*   PT/INR No results for input(s): LABPROT, INR in the last 72 hours. CMP     Component Value Date/Time   NA 136 03/13/2020 0319   K 3.3 (L) 03/13/2020 0319   CL 104 03/13/2020 0319   CO2 23 03/13/2020 0319   GLUCOSE 118 (H)  03/13/2020 0319   BUN 15 03/13/2020 0319   CREATININE 1.34 (H) 03/13/2020 0319   CREATININE 1.07 01/15/2020 1024   CALCIUM 8.1 (L) 03/13/2020 0319   PROT 6.4 (L) 03/13/2020 0319   ALBUMIN 3.2 (L) 03/13/2020 0319   AST 25 03/13/2020 0319   ALT 13 03/13/2020 0319   ALKPHOS 89 03/13/2020 0319   BILITOT 2.4 (H) 03/13/2020 0319   GFRNONAA 50 (L) 03/13/2020 0319   GFRNONAA 67 01/15/2020 1024   GFRAA 58 (L) 03/13/2020 0319   GFRAA 77 01/15/2020 1024   Lipase     Component Value Date/Time   LIPASE 20 03/11/2020 2022       Studies/Results: CT ABDOMEN PELVIS W CONTRAST  Result Date: 03/11/2020 CLINICAL DATA:  Right upper quadrant and epigastric pain EXAM: CT ABDOMEN AND PELVIS WITH CONTRAST TECHNIQUE: Multidetector CT imaging of the abdomen and pelvis was performed using the standard protocol following bolus administration of intravenous contrast. CONTRAST:  47mL OMNIPAQUE IOHEXOL 300 MG/ML  SOLN COMPARISON:  Radiograph 03/11/2020 FINDINGS: Lower chest: No acute abnormality. Hepatobiliary: Subcentimeter hypodensity in the right hepatic lobe too small to further characterize. Dilated gallbladder with wall thickening and/or pericholecystic fluid. Mild soft tissue stranding in the right upper quadrant consistent with edema. Probable small calcified stones best seen on coronal views. Mildly enhancing ductal walls on the coronal views. Common bile duct upper normal at 7 mm. Possible common duct stone,  series 2, image number 34, coronal series 5, image number 43. Pancreas: Unremarkable. No pancreatic ductal dilatation or surrounding inflammatory changes. Spleen: Normal in size without focal abnormality. Adrenals/Urinary Tract: Adrenal glands are normal. Cyst in the mid right kidney. No hydronephrosis. Bladder is normal Stomach/Bowel: Stomach is within normal limits. Appendix appears normal. Sigmoid colon diverticular disease without acute inflammatory change. No evidence of bowel wall thickening,  distention, or inflammatory changes. Vascular/Lymphatic: Moderate aortic atherosclerosis. 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 No suspicious nodes Reproductive: Enlarged prostate Other: No free air or free fluid. Musculoskeletal: Degenerative changes of the spine. No acute or suspicious osseous abnormality IMPRESSION: 1. Dilated gallbladder with suspected punctate stones. There is gallbladder wall thickening or pericholecystic fluid with soft tissue inflammatory process in the right upper quadrant, constellation of findings is concerning for an acute cholecystitis. Borderline dilatation of common bile duct with possible small filling defect or stone in the distal duct, correlate with LFTs. Follow-up MRCP as indicated. 2. 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 Recommend followup by ultrasound in 2 years. This recommendation follows ACR consensus guidelines: White Paper of the ACR Incidental Findings Committee II on Vascular Findings. J Am Coll Radiol 2013; 10:789-794. Aortic aneurysm NOS (ICD10-I71.9) 3. Sigmoid colon diverticular disease without acute inflammatory change. Electronically Signed   By: Jasmine Pang M.D.   On: 03/11/2020 22:56   MR 3D Recon At Scanner  Result Date: 03/12/2020 CLINICAL DATA:  Cholelithiasis. Rule out common bile duct stone. Right upper quadrant and epigastric pain. EXAM: MRI ABDOMEN WITHOUT AND WITH CONTRAST (INCLUDING MRCP) TECHNIQUE: Multiplanar multisequence MR imaging of the abdomen was performed both before and after the administration of intravenous contrast. Heavily T2-weighted images of the biliary and pancreatic ducts were obtained, and three-dimensional MRCP images were rendered by post processing. CONTRAST:  64mL GADAVIST GADOBUTROL 1 MMOL/ML IV SOLN COMPARISON:  03/11/2020 CT. FINDINGS: Portions of exam are moderately motion degraded. Lower chest: Mild cardiomegaly, without pericardial or pleural  effusion. Hepatobiliary: No suspicious liver lesion. Tiny gallstone on 22/3. Gallbladder wall thickening is similar, including at 5 mm. Mild pericholecystic edema, including on 18/3. T2 hypointensity of 4 mm on 17/3 is suspected to be in the cystic duct. Possibly correlated on 23/16. Borderline intrahepatic biliary duct dilatation. The common duct measures 7 mm, which is normal for age. Corresponding to the CT abnormality, within the distal common duct, is a T2 hypointense focus on the order of 3 mm on 13/11 and 30/16. Pancreas: No evidence of acute pancreatitis. At least 1 cystic focus within the pancreas including at 4 mm within the tail on 16/3. No main pancreatic duct dilatation. Spleen:  Normal in size, without focal abnormality. Adrenals/Urinary Tract: Normal adrenal glands. Interpolar dominant right renal 5.2 cm cyst. Normal left kidney. Stomach/Bowel: Normal stomach and abdominal bowel loops. Vascular/Lymphatic: Aortic atherosclerosis. Nonaneurysmal dilatation of the infrarenal aorta at 2.6 cm. No retroperitoneal or retrocrural adenopathy. Other:  Trace ascites in the right pericolic gutter. Musculoskeletal: No acute osseous abnormality. IMPRESSION: 1. Moderately motion degraded exam. 2. Cholelithiasis with suspicion of acute cholecystitis and possible cystic duct stone. 3. Isolated choledocholithiasis, as on CT. 4. At least 1 cystic focus within the pancreas. 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 Per consensus criteria, this warrants follow-up pre and post contrast abdominal MRI/MRCP at 2 years. This recommendation follows ACR consensus guidelines: Management of Incidental Pancreatic Cysts: A White Paper of the ACR Incidental Findings Committee. J Am Coll Radiol 2017;14:911-923. 5.  Aortic Atherosclerosis (ICD10-I70.0). Electronically Signed   By: Abigail Miyamoto M.D.   On: 03/12/2020 15:39   DG Abdomen Acute W/Chest  Result Date: 03/11/2020 CLINICAL DATA:  Epigastric pain, distension, nausea EXAM: DG ABDOMEN ACUTE W/  1V CHEST COMPARISON:  None. FINDINGS: Supine and upright frontal views of the abdomen as well as an upright frontal view of the chest are obtained. The cardiac silhouette is unremarkable. No airspace disease, effusion, or pneumothorax. Bowel gas pattern is unremarkable. No masses or abnormal calcifications. No free gas in the greater peritoneal sac. IMPRESSION: 1. Unremarkable abdominal series. Electronically Signed   By: Randa Ngo M.D.   On: 03/11/2020 20:44   MR ABDOMEN MRCP W WO CONTAST  Result Date: 03/12/2020 CLINICAL DATA:  Cholelithiasis. Rule out common bile duct stone. Right upper quadrant and epigastric pain. EXAM: MRI ABDOMEN WITHOUT AND WITH CONTRAST (INCLUDING MRCP) TECHNIQUE: Multiplanar multisequence MR imaging of the abdomen was performed both before and after the administration of intravenous contrast. Heavily T2-weighted images of the biliary and pancreatic ducts were obtained, and three-dimensional MRCP images were rendered by post processing. CONTRAST:  58mL GADAVIST GADOBUTROL 1 MMOL/ML IV SOLN COMPARISON:  03/11/2020 CT. FINDINGS: Portions of exam are moderately motion degraded. Lower chest: Mild cardiomegaly, without pericardial or pleural effusion. Hepatobiliary: No suspicious liver lesion. Tiny gallstone on 22/3. Gallbladder wall thickening is similar, including at 5 mm. Mild pericholecystic edema, including on 18/3. T2 hypointensity of 4 mm on 17/3 is suspected to be in the cystic duct. Possibly correlated on 23/16. Borderline intrahepatic biliary duct dilatation. The common duct measures 7 mm, which is normal for age. Corresponding to the CT abnormality, within the distal common duct, is a T2 hypointense focus on the order of 3 mm on 13/11 and 30/16. Pancreas: No evidence of acute pancreatitis. At least 1 cystic focus within the pancreas including at 4 mm within the tail on 16/3. No main pancreatic duct dilatation. Spleen:  Normal in size, without focal abnormality.  Adrenals/Urinary Tract: Normal adrenal glands. Interpolar dominant right renal 5.2 cm cyst. Normal left kidney. Stomach/Bowel: Normal stomach and abdominal bowel loops. Vascular/Lymphatic: Aortic atherosclerosis. Nonaneurysmal dilatation of the infrarenal aorta at 2.6 cm. No retroperitoneal or retrocrural adenopathy. Other:  Trace ascites in the right pericolic gutter. Musculoskeletal: No acute osseous abnormality. IMPRESSION: 1. Moderately motion degraded exam. 2. Cholelithiasis with suspicion of acute cholecystitis and possible cystic duct stone. 3. Isolated choledocholithiasis, as on CT. 4. At least 1 cystic focus within the pancreas. 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 Per consensus criteria, this warrants follow-up pre and post contrast abdominal MRI/MRCP at 2 years. This recommendation follows ACR consensus guidelines: Management of Incidental Pancreatic Cysts: A White Paper of the ACR Incidental Findings Committee. J Am Coll Radiol 7829;56:213-086. 5.  Aortic Atherosclerosis (ICD10-I70.0). Electronically Signed   By: Abigail Miyamoto M.D.   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

## 2020-03-14 ENCOUNTER — Encounter (HOSPITAL_COMMUNITY): Payer: Self-pay | Admitting: Certified Registered Nurse Anesthetist

## 2020-03-14 ENCOUNTER — Encounter (HOSPITAL_COMMUNITY)
Disposition: A | Discharge status: Home / Self Care | Payer: Self-pay | Source: Home / Self Care | Attending: Internal Medicine

## 2020-03-14 ENCOUNTER — Inpatient Hospital Stay (HOSPITAL_COMMUNITY): Payer: Medicare Other | Admitting: Anesthesiology

## 2020-03-14 ENCOUNTER — Inpatient Hospital Stay (HOSPITAL_COMMUNITY): Payer: Medicare Other

## 2020-03-14 ENCOUNTER — Encounter (HOSPITAL_COMMUNITY): Payer: Self-pay | Admitting: Internal Medicine

## 2020-03-14 DIAGNOSIS — K8020 Calculus of gallbladder without cholecystitis without obstruction: Secondary | ICD-10-CM

## 2020-03-14 DIAGNOSIS — K2970 Gastritis, unspecified, without bleeding: Secondary | ICD-10-CM

## 2020-03-14 HISTORY — PX: EUS: SHX5427

## 2020-03-14 HISTORY — PX: ESOPHAGOGASTRODUODENOSCOPY (EGD) WITH PROPOFOL: SHX5813

## 2020-03-14 HISTORY — PX: BILIARY DILATION: SHX6850

## 2020-03-14 HISTORY — PX: BIOPSY: SHX5522

## 2020-03-14 HISTORY — PX: ERCP: SHX5425

## 2020-03-14 HISTORY — PX: SPHINCTEROTOMY: SHX5544

## 2020-03-14 LAB — COMPREHENSIVE METABOLIC PANEL
ALT: 14 U/L (ref 0–44)
AST: 25 U/L (ref 15–41)
Albumin: 3 g/dL — ABNORMAL LOW (ref 3.5–5.0)
Alkaline Phosphatase: 92 U/L (ref 38–126)
Anion gap: 6 (ref 5–15)
BUN: 8 mg/dL (ref 8–23)
CO2: 22 mmol/L (ref 22–32)
Calcium: 8.3 mg/dL — ABNORMAL LOW (ref 8.9–10.3)
Chloride: 110 mmol/L (ref 98–111)
Creatinine, Ser: 1.27 mg/dL — ABNORMAL HIGH (ref 0.61–1.24)
GFR calc Af Amer: >60 mL/min (ref >60)
GFR calc non Af Amer: 54 mL/min — ABNORMAL LOW (ref >60)
Glucose, Bld: 111 mg/dL — ABNORMAL HIGH (ref 70–99)
Potassium: 4 mmol/L (ref 3.5–5.1)
Sodium: 138 mmol/L (ref 135–145)
Total Bilirubin: 1.8 mg/dL — ABNORMAL HIGH (ref 0.3–1.2)
Total Protein: 6.5 g/dL (ref 6.5–8.1)

## 2020-03-14 LAB — CBC WITH DIFFERENTIAL/PLATELET
Abs Immature Granulocytes: 0.01 10*3/uL (ref 0.00–0.07)
Basophils Absolute: 0 10*3/uL (ref 0.0–0.1)
Basophils Relative: 1 %
Eosinophils Absolute: 0.4 10*3/uL (ref 0.0–0.5)
Eosinophils Relative: 6 %
HCT: 37.9 % — ABNORMAL LOW (ref 39.0–52.0)
Hemoglobin: 12.3 g/dL — ABNORMAL LOW (ref 13.0–17.0)
Immature Granulocytes: 0 %
Lymphocytes Relative: 21 %
Lymphs Abs: 1.5 10*3/uL (ref 0.7–4.0)
MCH: 31.6 pg (ref 26.0–34.0)
MCHC: 32.5 g/dL (ref 30.0–36.0)
MCV: 97.4 fL (ref 80.0–100.0)
Monocytes Absolute: 0.9 10*3/uL (ref 0.1–1.0)
Monocytes Relative: 13 %
Neutro Abs: 4.2 10*3/uL (ref 1.7–7.7)
Neutrophils Relative %: 59 %
Platelets: 190 10*3/uL (ref 150–400)
RBC: 3.89 MIL/uL — ABNORMAL LOW (ref 4.22–5.81)
RDW: 12.3 % (ref 11.5–15.5)
WBC: 7 10*3/uL (ref 4.0–10.5)
nRBC: 0 % (ref 0.0–0.2)

## 2020-03-14 LAB — PROTIME-INR
INR: 1.2 (ref 0.8–1.2)
Prothrombin Time: 14.3 seconds (ref 11.4–15.2)

## 2020-03-14 SURGERY — ERCP, WITH INTERVENTION IF INDICATED
Anesthesia: General

## 2020-03-14 MED ORDER — PROPOFOL 10 MG/ML IV BOLUS
INTRAVENOUS | Status: AC
  Filled 2020-03-14: qty 20

## 2020-03-14 MED ORDER — GLUCAGON HCL RDNA (DIAGNOSTIC) 1 MG IJ SOLR
INTRAMUSCULAR | Status: DC | PRN
Start: 2020-03-14 — End: 2020-03-14
  Administered 2020-03-14 (×3): .25 mg via INTRAVENOUS

## 2020-03-14 MED ORDER — EPHEDRINE SULFATE-NACL 50-0.9 MG/10ML-% IV SOSY
PREFILLED_SYRINGE | INTRAVENOUS | Status: DC | PRN
  Administered 2020-03-14 (×2): 5 mg via INTRAVENOUS

## 2020-03-14 MED ORDER — PROPOFOL 500 MG/50ML IV EMUL
INTRAVENOUS | Status: DC | PRN
  Administered 2020-03-14: 100 ug/kg/min via INTRAVENOUS

## 2020-03-14 MED ORDER — SODIUM CHLORIDE 0.9 % IV SOLN
INTRAVENOUS | Status: DC | PRN
  Administered 2020-03-14: 30 mL

## 2020-03-14 MED ORDER — LACTATED RINGERS IV SOLN
INTRAVENOUS | Status: DC
  Administered 2020-03-14: 1000 mL via INTRAVENOUS

## 2020-03-14 MED ORDER — FENTANYL CITRATE (PF) 100 MCG/2ML IJ SOLN
INTRAMUSCULAR | Status: DC | PRN
  Administered 2020-03-14: 50 ug via INTRAVENOUS

## 2020-03-14 MED ORDER — FENTANYL CITRATE (PF) 100 MCG/2ML IJ SOLN
INTRAMUSCULAR | Status: AC
  Filled 2020-03-14: qty 2

## 2020-03-14 MED ORDER — GLUCAGON HCL RDNA (DIAGNOSTIC) 1 MG IJ SOLR
INTRAMUSCULAR | Status: AC
  Filled 2020-03-14: qty 1

## 2020-03-14 MED ORDER — INDOMETHACIN 50 MG RE SUPP: 100.0000 mg | Freq: Once | RECTAL | Status: DC

## 2020-03-14 MED ORDER — INDOMETHACIN 50 MG RE SUPP
RECTAL | Status: AC
  Filled 2020-03-14: qty 2

## 2020-03-14 MED ORDER — INDOMETHACIN 50 MG RE SUPP
RECTAL | Status: DC | PRN
  Administered 2020-03-14: 100 mg via RECTAL

## 2020-03-14 MED ORDER — ONDANSETRON HCL 4 MG/2ML IJ SOLN
INTRAMUSCULAR | Status: DC | PRN
  Administered 2020-03-14: 4 mg via INTRAVENOUS

## 2020-03-14 MED ORDER — PROPOFOL 10 MG/ML IV BOLUS
INTRAVENOUS | Status: DC | PRN
  Administered 2020-03-14: 40 mg via INTRAVENOUS
  Administered 2020-03-14 (×2): 20 mg via INTRAVENOUS
  Administered 2020-03-14: 50 mg via INTRAVENOUS
  Administered 2020-03-14 (×2): 20 mg via INTRAVENOUS

## 2020-03-14 MED ORDER — PANTOPRAZOLE SODIUM 40 MG PO TBEC
40.0000 mg | DELAYED_RELEASE_TABLET | Freq: Every day | ORAL | Status: DC
  Administered 2020-03-14 – 2020-03-17 (×3): 40 mg via ORAL
  Filled 2020-03-14 (×3): qty 1

## 2020-03-14 MED ORDER — SODIUM CHLORIDE 0.9 % IV SOLN: INTRAVENOUS | Status: DC

## 2020-03-14 MED ORDER — SUGAMMADEX SODIUM 200 MG/2ML IV SOLN
INTRAVENOUS | Status: DC | PRN
  Administered 2020-03-14: 157.8 mg via INTRAVENOUS

## 2020-03-14 NOTE — Anesthesia Procedure Notes (Signed)
Procedure Name: Intubation Date/Time: 03/14/2020 1:44 PM Performed by: Anne Fu, CRNA Pre-anesthesia Checklist: Patient identified, Emergency Drugs available, Suction available, Patient being monitored and Timeout performed Patient Re-evaluated:Patient Re-evaluated prior to induction Oxygen Delivery Method: Circle system utilized Preoxygenation: Pre-oxygenation with 100% oxygen Induction Type: IV induction Ventilation: Mask ventilation without difficulty Laryngoscope Size: Mac and 4 Grade View: Grade I Tube type: Oral Tube size: 7.0 mm Number of attempts: 1 Airway Equipment and Method: Stylet Placement Confirmation: ETT inserted through vocal cords under direct vision,  positive ETCO2 and breath sounds checked- equal and bilateral Secured at: 21 cm Tube secured with: Tape Dental Injury: Teeth and Oropharynx as per pre-operative assessment

## 2020-03-14 NOTE — Interval H&P Note (Signed)
History and Physical Interval Note:  03/14/2020 12:49 PM  [name]  has presented today for surgery, with the diagnosis of choledocholithiasis, mild intra and extrahepatic biliary dilatation.  The various methods of treatment have been discussed with the patient and family. After consideration of risks, benefits and other options for treatment, the patient has consented to  Procedure(s): ENDOSCOPIC RETROGRADE CHOLANGIOPANCREATOGRAPHY (ERCP) (N/A) as a surgical intervention.  The patient's history has been reviewed, patient examined, no change in status, stable for surgery.  I have reviewed the patient's chart and labs.  Questions were answered to the patient's satisfaction.    The risks of an ERCP were discussed at length, including but not limited to the risk of perforation, bleeding, abdominal pain, post-ERCP pancreatitis (while usually mild can be severe and even life threatening).  The risks of an EUS including intestinal perforation, bleeding, infection, aspiration, and medication effects were discussed as was the possibility it may not give a definitive diagnosis if a biopsy is performed.  When a biopsy of the pancreas is done as part of the EUS, there is an additional risk of pancreatitis at the rate of about 1-2%.  It was explained that procedure related pancreatitis is typically mild, although it can be severe and even life threatening, which is why we do not perform random pancreatic biopsies and only biopsy a lesion/area we feel is concerning enough to warrant the risk.   [employer]

## 2020-03-14 NOTE — Op Note (Signed)
[facility] Patient Name: Larry Salazar Procedure Date: 03/14/2020 MRN: [id number] Attending MD: Corliss Parish , MD Date of Birth: 1940/12/28 CSN: [id number] Age: 79 Admit Type: Inpatient Procedure:                Upper EUS Indications:              Elevated liver enzymes, Suspected                            choledocholithiasis Providers:                Corliss Parish, MD, Dwain Sarna, RN, Arlee Muslim Tech., Technician, Sherald Barge CRNA,                            CRNA Referring MD:             Doristine Locks, MD, Arnaldo Natal,                            [facility] Medicines:                Monitored Anesthesia Care Complications:            No immediate complications. Estimated Blood Loss:     Estimated blood loss was minimal. Procedure:                Pre-Anesthesia Assessment:                           - Prior to the procedure, a History and Physical                            was performed, and patient medications and                            allergies were reviewed. The patient's tolerance of                            previous anesthesia was also reviewed. The risks                            and benefits of the procedure and the sedation                            options and risks were discussed with the patient.                            All questions were answered, and informed consent                            was obtained. Prior Anticoagulants: The patient has                            taken no previous anticoagulant or antiplatelet  agents except for aspirin. ASA Grade Assessment:                            III - A patient with severe systemic disease. After                            reviewing the risks and benefits, the patient was                            deemed in satisfactory condition to undergo the                            procedure.                            After obtaining informed consent, the endoscope was                            passed under direct vision. Throughout the                            procedure, the patient's blood pressure, pulse, and                            oxygen saturations were monitored continuously.The                            upper EUS was accomplished without difficulty. The                            patient tolerated the procedure. The GIF-H190                            ([id number]) was introduced through the mouth, and                            advanced to the second part of duodenum. The                            [id number] ([id number]) Olympus Doudenoscope was                            introduced through the mouth, and advanced to the                            second part of duodenum. The ([id number]) [id number]                            Linear EUS was introduced through the mouth, and                            advanced to the duodenum for ultrasound examination  from the stomach and duodenum. Findings:      ENDOSCOPIC FINDING: :      No gross lesions were noted in the proximal esophagus and in the mid       esophagus.      Two tongues of salmon-colored mucosa were present from 38 to 40 cm. No       other visible abnormalities were present. The maximum longitudinal       extent of these esophageal mucosal changes was 2 cm in length. Biopsies       were taken with a cold forceps for histology to rule in/out Barrett's       esophagus.      Patchy moderate inflammation characterized by erosions, erythema and       granularity was found in the cardia, in the gastric body and in the       gastric antrum.      No other gross lesions were noted in the entire examined stomach.       Biopsies were taken with a cold forceps for histology and Helicobacter       pylori testing.      Patchy mildly erythematous mucosa without active bleeding and with no       stigmata of bleeding  was found in the duodenal bulb, in the first       portion of the duodenum and in the second portion of the duodenum.      A small diverticulum was found in the area of the papilla, with the       ampulla noted within it and hidden under a hood. Required intervention       to evaluate the ampulla itself.      ENDOSONOGRAPHIC FINDING: :      One stone was visualized endosonographically in the distal common bile       duct (2.2 mm -> 4.8 mm. The stone measured 4.6 mm x 3.7 mm in greatest       dimensions. The stone was round. It was hyperechoic and characterized by       shadowing.      One stone was visualized endosonographically in the cystic duct. The       stone measured 6.9 mm x 3.0 mm. The stone was round. It was hyperechoic       and characterized by shadowing.      Moderate hyperechoic material consistent with sludge as well as       cholelithiasis was visualized endosonographically in the gallbladder. Impression:               EGD Impression:                           - No gross lesions in esophagus proximally.                            Salmon-colored mucosa suspicious for short-segment                            Barrett's esophagus. Biopsied.                           - Gastritis. Biopsied.                           -  Erythematous duodenopathy.                           - Ampulla within duodenal diverticulum.                           EUS Impression:                           - One stone was visualized endosonographically in                            the common bile duct.                           - One stone was visualized endosonographically in                            the cystic duct.                           - Hyperechoic material consistent with sludge as                            well as cholelithiasis was visualized                            endosonographically in the gallbladder. Moderate Sedation:      Not Applicable - Patient had care per  Anesthesia. Recommendation:           - Proceed to attempt at ERCP with stone extraction.                           - Await path results.                           - Start Protonix 40 mg daily.                           - The findings and recommendations were discussed                            with the patient.                           - The findings and recommendations were discussed                            with the patient's family. Procedure Code(s):        --- Professional ---                           [phone], Esophagogastroduodenoscopy, flexible,                            transoral; with endoscopic ultrasound examination  limited to the esophagus, stomach or duodenum, and                            adjacent structures                           43239, Esophagogastroduodenoscopy, flexible,                            transoral; with biopsy, single or multiple Diagnosis Code(s):        --- Professional ---                           K22.8, Other specified diseases of esophagus                           K29.70, Gastritis, unspecified, without bleeding                           K31.89, Other diseases of stomach and duodenum                           K80.50, Calculus of bile duct without cholangitis                            or cholecystitis without obstruction                           K80.20, Calculus of gallbladder without                            cholecystitis without obstruction                           R74.8, Abnormal levels of other serum enzymes                           K57.10, Diverticulosis of small intestine without                            perforation or abscess without bleeding                           K83.8, Other specified diseases of biliary tract CPT copyright 2019 American Medical Association. All rights reserved. The codes documented in this report are preliminary and upon coder review may  be revised to meet current compliance  requirements. Corliss ParishGabriel Mansouraty, MD 03/14/2020 1:55:40 PM Number of Addenda: 0

## 2020-03-14 NOTE — Care Management Important Message (Signed)
Important Message  Patient Details IM Letter given to [name] SW Case Manager to present to the Patient Name: Larry Salazar MRN: [id number] Date of Birth: 1940/10/09   Medicare Important Message Given:  Yes     Caren Macadam 03/14/2020, 11:05 AM

## 2020-03-14 NOTE — Plan of Care (Signed)
  Problem: Education: Goal: Knowledge of General Education information will improve Description: Including pain rating scale, medication(s)/side effects and non-pharmacologic comfort measures Outcome: Progressing   Problem: Safety: Goal: Ability to remain free from injury will improve Outcome: Progressing   

## 2020-03-14 NOTE — Progress Notes (Signed)
Patient ID: Larry Salazar, male   DOB: 06/13/1941, 79 y.o.   MRN: [id number] Patient remains NPO for ERCP today at 12:30pm with Dr. Mansouraty. He remains afebrile. HR 55 -60. BP stable.   Brief exam today:  Heart: RRR, no murmurs. Lungs:Clear throughout. Abdomen: soft, RUQ less swollen today, very mild RUQ/epigastric tenderness. No rebound or guarding.  Wife at the bed side.  

## 2020-03-14 NOTE — H&P (View-Only) (Signed)
Patient ID: Larry Salazar, male   DOB: 1941/05/23, 79 y.o.   MRN: [id number] Patient remains NPO for ERCP today at 12:30pm with Dr. Meridee Score. He remains afebrile. HR 55 -60. BP stable.   Brief exam today:  Heart: RRR, no murmurs. Lungs:Clear throughout. Abdomen: soft, RUQ less swollen today, very mild RUQ/epigastric tenderness. No rebound or guarding.  Wife at the bed side.

## 2020-03-14 NOTE — Progress Notes (Signed)
    CC: Abdominal pain  Subjective: Patient is doing well this AM.  He still has pain primarily epigastric with palpation.  Awaiting ERCP.  Objective: Vital signs in last 24 hours: Temp:  [97.9 F (36.6 C)-98.6 F (37 C)] 97.9 F (36.6 C) (06/18 0440) Pulse Rate:  [55-60] 60 (06/18 0440) Resp:  [15-18] 15 (06/18 0440) BP: (128-134)/(80-84) 128/83 (06/18 0440) SpO2:  [95 %-99 %] 95 % (06/18 0440) Last BM Date: 03/13/20 720 p.o. 1200 IV 1025 urine No BM recorded Afebrile vital signs are stable Creatinine 1.44>> 1.30>> 1.34>> 1.27 Bilirubin 2.7>> 3.1>> 2.4>> 1.8 WBC 11.5>> 8.8>> 7.0>> 7.0   Intake/Output from previous day: 06/17 0701 - 06/18 0700 In: 1939.8 [P.O.:720; I.V.:1027.9; IV Piggyback:191.8] Out: 1025 [Urine:1025] Intake/Output this shift: No intake/output data recorded.  General appearance: alert, cooperative and no distress Resp: clear to auscultation bilaterally GI: Tender just right of the midepigastric area.  Positive bowel sounds remainder the abdomen soft and nondistended.  Lab Results:  Recent Labs    03/13/20 0319 03/14/20 0306  WBC 7.0 7.0  HGB 12.5* 12.3*  HCT 38.0* 37.9*  PLT 166 190    BMET Recent Labs    03/13/20 0319 03/14/20 0306  NA 136 138  K 3.3* 4.0  CL 104 110  CO2 23 22  GLUCOSE 118* 111*  BUN 15 8  CREATININE 1.34* 1.27*  CALCIUM 8.1* 8.3*   PT/INR Recent Labs    03/14/20 0306  LABPROT 14.3  INR 1.2    Recent Labs  Lab 03/11/20 2022 03/12/20 0437 03/13/20 0319 03/14/20 0306  AST 21 19 25 25   ALT 14 13 13 14   ALKPHOS 86 80 89 92  BILITOT 2.7* 3.1* 2.4* 1.8*  PROT 7.4 6.9 6.4* 6.5  ALBUMIN 3.8 3.4* 3.2* 3.0*     Lipase     Component Value Date/Time   LIPASE 20 03/11/2020 2022     Medications: . levothyroxine  75 mcg Oral Q0600  . 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 Labs ordered for tomorrow AM.   LOS: 2 days    Prisha Hiley 03/14/2020 Please see Amion

## 2020-03-14 NOTE — Anesthesia Postprocedure Evaluation (Signed)
Anesthesia Post Note  Patient: Larry Salazar  Procedure(s) Performed: ENDOSCOPIC RETROGRADE CHOLANGIOPANCREATOGRAPHY (ERCP) (N/A ) UPPER ENDOSCOPIC ULTRASOUND (EUS) LINEAR BIOPSY SPHINCTEROTOMY BILIARY DILATION     Patient location during evaluation: PACU Anesthesia Type: General Level of consciousness: awake and alert Pain management: pain level controlled Vital Signs Assessment: post-procedure vital signs reviewed and stable Respiratory status: spontaneous breathing, nonlabored ventilation, respiratory function stable and patient connected to nasal cannula oxygen Cardiovascular status: blood pressure returned to baseline and stable Postop Assessment: no apparent nausea or vomiting Anesthetic complications: no   No complications documented.  Last Vitals:  Vitals:   03/14/20 1520 03/14/20 1554  BP: (!) 183/85 (!) 152/96  Pulse: (!) 52 (!) 47  Resp: 14 16  Temp:  36.4 C  SpO2: 95% 95%    Last Pain:  Vitals:   03/14/20 1554  TempSrc:   PainSc: 0-No pain                 Deanna Wiater

## 2020-03-14 NOTE — Progress Notes (Signed)
PHARMACY NOTE -  Zosyn  Pharmacy has been assisting with dosing of Zosyn for IAI.  Dosage remains stable at 3.375g IV q8 hr and need for further dosage adjustment appears unlikely at present given renal function improving  Pharmacy will sign off, following peripherally for culture results or dose adjustments. Please reconsult if a change in clinical status warrants re-evaluation of dosage.  Bernadene Person, PharmD, BCPS [phone] 03/14/2020, 10:58 AM

## 2020-03-14 NOTE — Anesthesia Preprocedure Evaluation (Addendum)
Anesthesia Evaluation  Patient identified by MRN, date of birth, ID band Patient awake    Reviewed: Allergy & Precautions, H&P , NPO status , Patient's Chart, lab work & pertinent test results, reviewed documented beta blocker date and time   Airway Mallampati: II  TM Distance: >3 FB Neck ROM: full    Dental no notable dental hx. (+) Edentulous Upper, Missing, Poor Dentition   Pulmonary neg pulmonary ROS, former smoker,    Pulmonary exam normal breath sounds clear to auscultation       Cardiovascular Exercise Tolerance: Good negative cardio ROS   Rhythm:regular Rate:Normal     Neuro/Psych negative neurological ROS  negative psych ROS   GI/Hepatic negative GI ROS, Neg liver ROS,   Endo/Other  Hypothyroidism   Renal/GU negative Renal ROS  negative genitourinary   Musculoskeletal  (+) Arthritis , Rheumatoid disorders,    Abdominal   Peds  Hematology negative hematology ROS (+)   Anesthesia Other Findings   Reproductive/Obstetrics negative OB ROS                            Anesthesia Physical Anesthesia Plan  ASA: III  Anesthesia Plan: General   Post-op Pain Management:    Induction: Intravenous  PONV Risk Score and Plan:   Airway Management Planned: Oral ETT  Additional Equipment:   Intra-op Plan:   Post-operative Plan: Extubation in OR  Informed Consent: I have reviewed the patients History and Physical, chart, labs and discussed the procedure including the risks, benefits and alternatives for the proposed anesthesia with the patient or authorized representative who has indicated his/her understanding and acceptance.     Dental Advisory Given  Plan Discussed with: CRNA and Anesthesiologist  Anesthesia Plan Comments: (  )        Anesthesia Quick Evaluation

## 2020-03-14 NOTE — Progress Notes (Signed)
PROGRESS NOTE    Larry Salazar  XLK:[id number] DOB: Aug 19, 1941 DOA: 03/11/2020 PCP: Heron Nay, PA    Brief Narrative:  Patient admitted to the hospital with working diagnosis of acute cholecystitis.  79 year old male who presented with weakness and abdominal pain.  He does have significant past medical history for rheumatoid arthritis, hypothyroidism, and chronic kidney disease stage III.  Patient reported abdominal discomfort for about 2 weeks, mainly in the epigastric area, with no nausea, vomiting or diarrhea.  On the day of admission patient fell onto the floor after feeling dizzy and very weak well getting out of his car.  On his initial physical examination blood pressure 128/77, heart rate 63, respirate 23, oxygen saturation 97%, temperature 98.4, his lungs are clear to auscultation bilaterally, heart S1-S2, present rhythmic, the abdomen was tender in the right upper quadrant, no rigidity or rebound, lower extremity edema. CT of the abdomen had dilated gallbladder with suspected punctate stones.  Gallbladder wall thickening and pericholecystic fluid, soft tissue inflammatory process, consistent with acute cholecystitis.  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  He has been placed on intravenous fluids, IV antibiotics and analgesics.  Further work-up with MRCP showed cholelithiasis with suspicion of acute cholecystitis and possible cystic duct stone.  Surgery and gastroenterology have been consulted.  Patient will undergo ERCP today.    Assessment & Plan:   Principal Problem:   Acute cholecystitis Active Problems:   Rheumatoid arthritis, seronegative, multiple sites [facility])   History of hypothyroidism   RUQ pain   Abdominal pain, epigastric   Choledocholithiasis    1. Acute cholecystitis complicated with cystic duct obstructing stone. Improved abdominal pain but not yet back to baseline. No nausea or vomiting, and continue tolerating clear  liquids. Wbc is 7,0, total bilirubin down to 1,8, with stable AST 25 and ALT 14.  On IV antibiotic therapy with IV Zosyn. Continue with IV fluids and pain control with as needed IV morphine/ oxycodone. Antiemetics with zofran,    Continue to follow on liver panel in am.   2. AKI on CKD stage 3a (base cr is 1,2). Hypokalemia.  Renal function with serum cr down to 1,27 with K at 4,0 and serum bicarbonate at 22. Will continue gentle hydration with IV fluids, balanced electrolyte solutions with dextrose.   Follow renal panel in am.   3. Hypothyroid. On levothyroxine.   4. RA. Continue to hold on methotrexate for now due to acute infection   5. Dyslipidemia. Continue with simvastatin.   Status is: Inpatient  Remains inpatient appropriate because:IV treatments appropriate due to intensity of illness or inability to take PO   Dispo: The patient is from: Home              Anticipated d/c is to: Home              Anticipated d/c date is: 2 days              Patient currently is not medically stable to d/c.   DVT prophylaxis: Enoxaparin   Code Status:   full  Family Communication:  I spoke with patient's wife at the bedside, we talked in detail about patient's condition, plan of care and prognosis and all questions were addressed.    Consultants:   GI   Surgery   Procedures:   EUS  ERCP  Antimicrobials:   IV Zosyn     Subjective: Patient is feeling better, he has been npo for procedure today, but  has been tolerating well clears, no nausea or vomiting.   Objective: Vitals:   03/13/20 1448 03/13/20 2034 03/14/20 0440 03/14/20 1207  BP: 130/84 134/80 128/83 (!) 170/87  Pulse: (!) 56 (!) 55 60 (!) 54  Resp: 18 15 15 18   Temp: 98.6 F (37 C) 98.3 F (36.8 C) 97.9 F (36.6 C) 99 F (37.2 C)  TempSrc:    Oral  SpO2: 99% 97% 95% 96%  Weight:    78.9 kg  Height:    5\' 5"  (1.651 m)    Intake/Output Summary (Last 24 hours) at 03/14/2020 1234 Last data filed at  03/14/2020 1000 Gross per 24 hour  Intake 2238.58 ml  Output 1025 ml  Net 1213.58 ml   Filed Weights   03/11/20 1922 03/14/20 1207  Weight: 78 kg 78.9 kg    Examination:   General: Not in pain or dyspnea, deconditioned Neurology: Awake and alert, non focal  E ENT: no pallor, improving icterus, oral mucosa moist Cardiovascular: No JVD. S1-S2 present, rhythmic, no gallops, rubs, or murmurs. No lower extremity edema. Pulmonary: positive  breath sounds bilaterally, adequate air movement, no wheezing, rhonchi or rales. Gastrointestinal. Abdomen with no distention, with no organomegaly, non tender to superficial palpation, no rebound or guarding Skin. No rashes Musculoskeletal: no joint deformities     Data Reviewed: I have personally reviewed following labs and imaging studies  CBC: Recent Labs  Lab 03/11/20 2022 03/12/20 0437 03/13/20 0319 03/14/20 0306  WBC 11.5* 8.8 7.0 7.0  NEUTROABS 8.2* 5.5  --  4.2  HGB 13.8 13.0 12.5* 12.3*  HCT 42.1 39.3 38.0* 37.9*  MCV 96.8 97.8 97.4 97.4  PLT 179 163 166 190   Basic Metabolic Panel: Recent Labs  Lab 03/11/20 2022 03/12/20 0437 03/13/20 0319 03/14/20 0306  NA 135 135 136 138  K 3.8 4.1 3.3* 4.0  CL 100 103 104 110  CO2 25 25 23 22   GLUCOSE 119* 123* 118* 111*  BUN 19 17 15 8   CREATININE 1.44* 1.30* 1.34* 1.27*  CALCIUM 8.8* 8.6* 8.1* 8.3*  MG  --   --  2.1  --    GFR: Estimated Creatinine Clearance: 46.4 mL/min (A) (by C-G formula based on SCr of 1.27 mg/dL (H)). Liver Function Tests: Recent Labs  Lab 03/11/20 2022 03/12/20 0437 03/13/20 0319 03/14/20 0306  AST 21 19 25 25   ALT 14 13 13 14   ALKPHOS 86 80 89 92  BILITOT 2.7* 3.1* 2.4* 1.8*  PROT 7.4 6.9 6.4* 6.5  ALBUMIN 3.8 3.4* 3.2* 3.0*   Recent Labs  Lab 03/11/20 2022  LIPASE 20   No results for input(s): AMMONIA in the last 168 hours. Coagulation Profile: Recent Labs  Lab 03/14/20 0306  INR 1.2   Cardiac Enzymes: No results for input(s):  CKTOTAL, CKMB, CKMBINDEX, TROPONINI in the last 168 hours. BNP (last 3 results) No results for input(s): PROBNP in the last 8760 hours. HbA1C: No results for input(s): HGBA1C in the last 72 hours. CBG: No results for input(s): GLUCAP in the last 168 hours. Lipid Profile: No results for input(s): CHOL, HDL, LDLCALC, TRIG, CHOLHDL, LDLDIRECT in the last 72 hours. Thyroid Function Tests: No results for input(s): TSH, T4TOTAL, FREET4, T3FREE, THYROIDAB in the last 72 hours. Anemia Panel: No results for input(s): VITAMINB12, FOLATE, FERRITIN, TIBC, IRON, RETICCTPCT in the last 72 hours.    Radiology Studies: I have reviewed all of the imaging during this hospital visit personally     Scheduled Meds: .  indomethacin  100 mg Rectal Once  . [MAR Hold] levothyroxine  75 mcg Oral Q0600  . [MAR Hold] simvastatin  40 mg Oral q1800   Continuous Infusions: . sodium chloride    . dextrose 5% lactated ringers 75 mL/hr at 03/13/20 2359  . lactated ringers 1,000 mL (03/14/20 1233)  . [MAR Hold] piperacillin-tazobactam (ZOSYN)  IV 3.375 g (03/14/20 0629)     LOS: 2 days        My Rinke Gerome Apley, MD

## 2020-03-14 NOTE — Op Note (Signed)
[facility] Patient Name: Larry Salazar Procedure Date: 03/14/2020 MRN: [id number] Attending MD: Justice Britain , MD Date of Birth: [birth year] CSN: [id number] Age: 79 Admit Type: Inpatient Procedure:                ERCP Indications:              Bile duct stone(s), Abnormal endoscopic ultrasound                            of the biliary system, For therapy of bile duct                            stone(s) Providers:                Justice Britain, MD, Cleda Daub, RN, Elspeth Cho Tech., Technician, Anne Fu                            CRNA, CRNA Referring MD:             Gerrit Heck, MD, Noralyn Pick,                            [facility] Medicines:                General Anesthesia, Zosyn 3.375 g IV, Indomethacin                            100 mg PR, Glucagon 5.32 mg IV Complications:            No immediate complications. Estimated Blood Loss:     Estimated blood loss was minimal. Procedure:                Pre-Anesthesia Assessment:                           - Prior to the procedure, a History and Physical                            was performed, and patient medications and                            allergies were reviewed. The patient's tolerance of                            previous anesthesia was also reviewed. The risks                            and benefits of the procedure and the sedation                            options and risks were discussed with the patient.  All questions were answered, and informed consent                            was obtained. Prior Anticoagulants: The patient has                            taken no previous anticoagulant or antiplatelet                            agents except for aspirin. ASA Grade Assessment:                            III - A patient with severe systemic disease. After                            reviewing the  risks and benefits, the patient was                            deemed in satisfactory condition to undergo the                            procedure.                           After obtaining informed consent, the scope was                            passed under direct vision. Throughout the                            procedure, the patient's blood pressure, pulse, and                            oxygen saturations were monitored continuously. The                            [id number]-Q180V ([id number]) Olympus Doudenoscope was                            introduced through the mouth, and used to inject                            contrast into and used to inject contrast into the                            bile duct. The ERCP was accomplished without                            difficulty. The patient tolerated the procedure. Scope In: Scope Out: Findings:      The scout film was normal.      The esophagus was successfully intubated under direct vision without       detailed examination of the pharynx, larynx, and associated structures,       and upper GI tract. The  major papilla was small and located entirely       within a diverticulum.      The best position for evaluation of the ampulla was the semi-long       position. In this semi-long position, a short 0.035 inch Soft Jagwire       was passed into the biliary tree. The Autotome sphincterotome was passed       over the guidewire and the bile duct was then deeply cannulated.       Contrast was injected. I personally interpreted the bile duct images.       Ductal flow of contrast was adequate. Image quality was adequate.       Contrast extended to the hepatic ducts. Opacification of the entire       biliary tree except for the cystic duct and gallbladder was successful.       The maximum diameter of the ducts was 5 mm. The lower third of the main       duct contained filling defect thought to be a stone. A 5 mm biliary       sphincterotomy was  made with a monofilament Autotome sphincterotome       using ERBE electrocautery. There was no post-sphincterotomy bleeding. I       could not perform a larger sphincterotomy as I had already gone to the       duodenal wall in complete extension while within the diverticulum.       Dilation of the distal common bile duct with a 6 mm Hurricane balloon       dilator was successful as a sphincteroplasty for 4-minutes. To discover       objects, the biliary tree was swept with a retrieval balloon starting at       the bifurcation. Sludge was swept from the duct. One stone was removed.       No stones remained. An occlusion cholangiogram was performed that showed       no further significant biliary pathology.      A pancreatogram was not performed.      The duodenoscope was withdrawn from the patient. Impression:               - The major papilla was small and located entirely                            within a diverticulum.                           - A filling defect consistent with a stone was seen                            on the cholangiogram.                           - Choledocholithiasis and biliary sludge was found.                            Complete removal was accomplished by biliary                            sphincterotomy, sphincteroplasty, and balloon sweep.                           -  The cystic duct and gallbladder did not fill with                            occlusion. Moderate Sedation:      Not Applicable - Patient had care per Anesthesia. Recommendation:           - The patient will be observed post-procedure,                            until all discharge criteria are met.                           - Return patient to hospital ward for ongoing care.                           - Clear liquid diet.                           - Observe patient's clinical course.                           - Check liver enzymes (AST, ALT, alkaline                            phosphatase,  bilirubin) in the morning.                           - Proceed to Cholecystectomy as per Surgical                            service for definitive treatment. Cystic duct did                            not opacify even with occlusion cholangiography. CD                            stone noted on EUS is likely etiology for this.                            Lower than normal CD insertion noted on EUS.                           - Watch for pancreatitis, bleeding, perforation,                            and cholangitis.                           - The findings and recommendations were discussed                            with the patient.                           - The findings and recommendations were discussed  with the patient's family.                           - The findings and recommendations were discussed                            with the referring physician. Procedure Code(s):        --- Professional ---                           [id number], Endoscopic retrograde                            cholangiopancreatography (ERCP); with removal of                            calculi/debris from biliary/pancreatic duct(s) Diagnosis Code(s):        --- Professional ---                           R93.2, Abnormal findings on diagnostic imaging of                            liver and biliary tract                           K80.50, Calculus of bile duct without cholangitis                            or cholecystitis without obstruction                           K83.8, Other specified diseases of biliary tract CPT copyright 2019 [employer]. All rights reserved. The codes documented in this report are preliminary and upon coder review may  be revised to meet current compliance requirements. Justice Britain, MD 03/14/2020 3:06:31 PM Number of Addenda: 0

## 2020-03-14 NOTE — Plan of Care (Signed)
Plan of care reviewed and discussed with the patient. 

## 2020-03-14 NOTE — Transfer of Care (Signed)
Immediate Anesthesia Transfer of Care Note  Patient: Larry Salazar  Procedure(s) Performed: Procedure(s): ENDOSCOPIC RETROGRADE CHOLANGIOPANCREATOGRAPHY (ERCP) (N/A) UPPER ENDOSCOPIC ULTRASOUND (EUS) LINEAR BIOPSY  Patient Location: PACU  Anesthesia Type:MAC and General  Level of Consciousness:  sedated, patient cooperative and responds to stimulation  Airway & Oxygen Therapy:Patient Spontanous Breathing and Patient connected to face mask oxgen  Post-op Assessment:  Report given to PACU RN and Post -op Vital signs reviewed and stable  Post vital signs:  Reviewed and stable  Last Vitals:  Vitals:   03/14/20 0440 03/14/20 1207  BP: 128/83 (!) 170/87  Pulse: 60 (!) 54  Resp: 15 18  Temp: 36.6 C 37.2 C  SpO2: 83% 81%    Complications: No apparent anesthesia complications

## 2020-03-15 LAB — SURGICAL PCR SCREEN
MRSA, PCR: NEGATIVE
Staphylococcus aureus: NEGATIVE

## 2020-03-15 LAB — COMPREHENSIVE METABOLIC PANEL
ALT: 12 U/L (ref 0–44)
AST: 20 U/L (ref 15–41)
Albumin: 2.9 g/dL — ABNORMAL LOW (ref 3.5–5.0)
Alkaline Phosphatase: 81 U/L (ref 38–126)
Anion gap: 6 (ref 5–15)
BUN: 7 mg/dL — ABNORMAL LOW (ref 8–23)
CO2: 24 mmol/L (ref 22–32)
Calcium: 8.2 mg/dL — ABNORMAL LOW (ref 8.9–10.3)
Chloride: 106 mmol/L (ref 98–111)
Creatinine, Ser: 1.26 mg/dL — ABNORMAL HIGH (ref 0.61–1.24)
GFR calc Af Amer: >60 mL/min (ref >60)
GFR calc non Af Amer: 54 mL/min — ABNORMAL LOW (ref >60)
Glucose, Bld: 128 mg/dL — ABNORMAL HIGH (ref 70–99)
Potassium: 3.3 mmol/L — ABNORMAL LOW (ref 3.5–5.1)
Sodium: 136 mmol/L (ref 135–145)
Total Bilirubin: 1.4 mg/dL — ABNORMAL HIGH (ref 0.3–1.2)
Total Protein: 6.2 g/dL — ABNORMAL LOW (ref 6.5–8.1)

## 2020-03-15 LAB — CBC
HCT: 36.5 % — ABNORMAL LOW (ref 39.0–52.0)
Hemoglobin: 12 g/dL — ABNORMAL LOW (ref 13.0–17.0)
MCH: 31.7 pg (ref 26.0–34.0)
MCHC: 32.9 g/dL (ref 30.0–36.0)
MCV: 96.3 fL (ref 80.0–100.0)
Platelets: 183 10*3/uL (ref 150–400)
RBC: 3.79 MIL/uL — ABNORMAL LOW (ref 4.22–5.81)
RDW: 12.1 % (ref 11.5–15.5)
WBC: 8.7 10*3/uL (ref 4.0–10.5)
nRBC: 0 % (ref 0.0–0.2)

## 2020-03-15 LAB — LIPASE, BLOOD: Lipase: 1044 U/L — ABNORMAL HIGH (ref 11–51)

## 2020-03-15 MED ORDER — PROPOFOL 10 MG/ML IV BOLUS
INTRAVENOUS | Status: AC
  Filled 2020-03-15: qty 20

## 2020-03-15 MED ORDER — BUPIVACAINE-EPINEPHRINE (PF) 0.5% -1:200000 IJ SOLN
INTRAMUSCULAR | Status: AC
  Filled 2020-03-15: qty 30

## 2020-03-15 MED ORDER — POTASSIUM CHLORIDE CRYS ER 20 MEQ PO TBCR
40.0000 meq | EXTENDED_RELEASE_TABLET | Freq: Once | ORAL | Status: AC
  Administered 2020-03-15: 40 meq via ORAL
  Filled 2020-03-15: qty 2

## 2020-03-15 MED ORDER — MIDAZOLAM HCL 2 MG/2ML IJ SOLN
INTRAMUSCULAR | Status: AC
  Filled 2020-03-15: qty 2

## 2020-03-15 MED ORDER — FENTANYL CITRATE (PF) 250 MCG/5ML IJ SOLN
INTRAMUSCULAR | Status: AC
  Filled 2020-03-15: qty 5

## 2020-03-15 NOTE — Progress Notes (Signed)
CROSS COVER FOR [facility] Subjective: Patient is a 79 year old white male who who was admitted with a diagnosis of acute cholecystitis underwent an EUS and ERCP yesterday for choledocholithiasis and sludge in the bile duct. He was scheduled for laparoscopic cholecystectomy this morning but was found to have a lipase elevated at 1044. The patient denies having any nausea or vomiting at this time but has had some epigastric discomfort which he feels is not different from the pain he had before.  He is complaining about not having had a meal in over a week and is quite upset about having his cholecystectomy postponed.  Objective: Vital signs in last 24 hours: Temp:  [97.6 F (36.4 C)-99 F (37.2 C)] 97.7 F (36.5 C) (06/19 0531) Pulse Rate:  [41-60] 41 (06/19 0531) Resp:  [14-19] 16 (06/19 0531) BP: (124-196)/(74-98) 124/74 (06/19 0531) SpO2:  [94 %-100 %] 95 % (06/19 0531) Weight:  [78.9 kg] 78.9 kg (06/18 1207) Last BM Date: 03/14/20  Intake/Output from previous day: 06/18 0701 - 06/19 0700 In: 3064 [P.O.:480; I.V.:2481.4; IV Piggyback:102.6] Out: 685 [Urine:675; Blood:10] Intake/Output this shift: Total I/O In: 119.7 [I.V.:102.6; IV Piggyback:17.2] Out: -   General appearance: alert, cooperative, appears stated age, no acute distress Resp: clear to auscultation bilaterally Cardio: regular rate and rhythm, S1, S2 normal, no murmur, click, rub or gallop GI: soft with minimal epigastric tenderness on palpation with no gaurding, rebound or rigidity; bowel sounds are normal; no masses, no organomegaly Extremities: extremities normal, atraumatic, no cyanosis or edema  Lab Results: Recent Labs    03/13/20 0319 03/14/20 0306 03/15/20 0332  WBC 7.0 7.0 8.7  HGB 12.5* 12.3* 12.0*  HCT 38.0* 37.9* 36.5*  PLT 166 190 183   BMET Recent Labs    03/13/20 0319 03/14/20 0306 03/15/20 0332  NA 136 138 136  K 3.3* 4.0 3.3*  CL 104 110 106  CO2 23 22 24   GLUCOSE 118* 111* 128*  BUN 15  8 7*  CREATININE 1.34* 1.27* 1.26*  CALCIUM 8.1* 8.3* 8.2*   LFT Recent Labs    03/13/20 0319 03/14/20 0306 03/15/20 0332  PROT 6.4*   < > 6.2*  ALBUMIN 3.2*   < > 2.9*  AST 25   < > 20  ALT 13   < > 12  ALKPHOS 89   < > 81  BILITOT 2.4*   < > 1.4*  BILIDIR 0.4*  --   --    < > = values in this interval not displayed.   PT/INR Recent Labs    03/14/20 0306  LABPROT 14.3  INR 1.2   Studies/Results: DG ERCP BILIARY & PANCREATIC DUCTS  Result Date: 03/14/2020 CLINICAL DATA:  79 year old male with choledocholithiasis EXAM: ERCP TECHNIQUE: Multiple spot images obtained with the fluoroscopic device and submitted for interpretation post-procedure. FLUOROSCOPY TIME:  Fluoroscopy Time:  3 minutes 8 seconds COMPARISON:  None. FINDINGS: A total of 15 intraoperative saved images are submitted for review. The images demonstrate a flexible duodenal scope in the descending duodenum followed by wire cannulation of the common bile duct, sphincterotomy, and balloon sweeping of the common duct. Initial cholangiography demonstrates some evidence of filling defects in the distal common duct consistent with stones. IMPRESSION: ERCP with sphincterotomy and balloon sweeping of the common duct. These images were submitted for radiologic interpretation only. Please see the procedural report for the amount of contrast and the fluoroscopy time utilized. Electronically Signed   By: 71 M.D.   On: 03/14/2020 15:00  Medications: I have reviewed the patient's current medications.  Assessment/Plan: 1) Post ERCP pancreatitis in the setting of acute cholecystitis and a cystic duct stone that was removed on repeat ERCP-continue supportive care with bowel rest and IV antibiotics for now.  Patient is currently on Zosyn. Rrepeat lipase will be checked tomorrow morning and cholecystectomy will be performed once pancreatitis resolves.  2) AKI superimposed on CKD. 3) Hypothyroidism on Levothyroxine. 4)  Rheumatoid arthritis-his methotrexate is on hold for now. 5) Dyslipidemia on Simvastatin.  LOS: 3 days  Juanita Craver 03/15/2020, 9:05 AM Juanita Craver, MD  03/15/2020, 9:05 AM

## 2020-03-15 NOTE — Progress Notes (Signed)
PROGRESS NOTE    Larry Salazar  EXB:[id number] DOB: May 20, 1941 DOA: 03/11/2020 PCP: Heron Nay, PA    Brief Narrative:  Patient admitted to the hospital with working diagnosis of acute cholecystitis.  79 year old male who presented with weakness and abdominal pain. He does have significant past medical history for rheumatoid arthritis, hypothyroidism, andchronic kidney disease stage III. Patient reported abdominal discomfort for about 2 weeks, mainly in the epigastric area, with no nausea, vomiting or diarrhea. On the day of admission patient fell onto the floor after feeling dizzy and very weak well getting out of hiscar. On his initial physical examination blood pressure 128/77, heart rate 63, respirate 23, oxygen saturation 97%, temperature 98.4, his lungs are clear to auscultation bilaterally, heart S1-S2, present rhythmic, the abdomen was tender in the right upper quadrant, no rigidity or rebound, lower extremity edema. CT of the abdomen had dilated gallbladder with suspected punctate stones. Gallbladder wall thickening and pericholecystic fluid,soft tissue inflammatory process, consistent with acute cholecystitis. 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  He has been placed on intravenous fluids, IV antibiotics and analgesics. Further work-up with MRCP showed cholelithiasis with suspicion of acute cholecystitis and possible cystic duct stone.  Surgery and gastroenterology have been consulted.   Patient underwent ERCP, finding choledocholithiasis and biliary sludge, complete removal was accomplished with biliary sphincterectomy, sphincteroplasty and balloon sweep.   Post procedure elevated lipase, and cholecystectomy has been postponed, possible surgery in am if improvement in lipase.     Assessment & Plan:   Principal Problem:   Acute cholecystitis Active Problems:   Rheumatoid arthritis, seronegative, multiple sites [facility])   History of  hypothyroidism   RUQ pain   Abdominal pain, epigastric   Choledocholithiasis    1. Acute cholecystitis complicated with cystic duct obstructing stone/ choledocolithiasis. Sp biliary sphincterectomy, sphincteroplasty and balloon sweep, removal of choledocholithiasis and sludge.   Continue with IV antibiotic therapy with Zosyn.   Surgery postponed due to elevated lipase1,044 with suspected NEW post ERCP acute pancreatitis. Patient with no significant abdominal pain, he has been placed NPO for now, allowed to have ice chips.   Continue bowel rest and will follow on lipase in am. Follow surgery recommendations for timing on cholecystectomy. Patient continue to have a sone in the cystic duct.    2. AKI on CKD stage 3a (base cr is 1,2). Hypokalemia.  Stable renal function with serum cr at 1.26 with K at 3,3 and serum bicarbonate at 24.   Will continue K correction with Kcl and will follow on Mg in am. Continue IV fluids with balanced electrolyte solutions with dextrose.  Follow renal panel in am.   3. Hypothyroid. continue with levothyroxine.   4. RA. Holding on methotrexate for now due to acute infection  5. Dyslipidemia. On simvastatin.    Patient continue to be at high risk for worsening pancreatitis.   Status is: Inpatient  Remains inpatient appropriate because:IV treatments appropriate due to intensity of illness or inability to take PO   Dispo: The patient is from: Home              Anticipated d/c is to: Home              Anticipated d/c date is: 2 days              Patient currently is not medically stable to d/c.   DVT prophylaxis:  enoxaparin   Code Status:    full  Family Communication:  No  family at the bedside     Consultants:   GI   Surgery    Procedures:   ERCP   Antimicrobials:   IV Zosyn     Subjective: Patient with no nausea or vomiting, continue to have mild epigastric abdominal pain with new abdominal distention, positive flatus.    Objective: Vitals:   03/14/20 2044 03/15/20 0117 03/15/20 0531 03/15/20 0936  BP: (!) 143/74 133/75 124/74 118/68  Pulse: (!) 45 (!) 44 (!) 41 (!) 43  Resp: 14 14 16 16   Temp: 98 F (36.7 C) 97.9 F (36.6 C) 97.7 F (36.5 C) 97.8 F (36.6 C)  TempSrc:  Oral  Oral  SpO2: 96% 94% 95% 95%  Weight:      Height:        Intake/Output Summary (Last 24 hours) at 03/15/2020 1353 Last data filed at 03/15/2020 1300 Gross per 24 hour  Intake 3344.81 ml  Output 685 ml  Net 2659.81 ml   Filed Weights   03/11/20 1922 03/14/20 1207  Weight: 78 kg 78.9 kg    Examination:   General: Not in pain or dyspnea. Deconditioned  Neurology: Awake and alert, non focal  E ENT: mild pallor, no icterus, oral mucosa moist Cardiovascular: No JVD. S1-S2 present, rhythmic, no gallops, rubs, or murmurs. No lower extremity edema. Pulmonary: vesicular breath sounds bilaterally, adequate air movement, no wheezing, rhonchi or rales. Gastrointestinal. Abdomen with positive distention, tympanic to percussion, no organomegaly, no rebound or guarding. M Skin. No rashes Musculoskeletal: no joint deformities     Data Reviewed: I have personally reviewed following labs and imaging studies  CBC: Recent Labs  Lab 03/11/20 2022 03/12/20 0437 03/13/20 0319 03/14/20 0306 03/15/20 0332  WBC 11.5* 8.8 7.0 7.0 8.7  NEUTROABS 8.2* 5.5  --  4.2  --   HGB 13.8 13.0 12.5* 12.3* 12.0*  HCT 42.1 39.3 38.0* 37.9* 36.5*  MCV 96.8 97.8 97.4 97.4 96.3  PLT 179 163 166 190 183   Basic Metabolic Panel: Recent Labs  Lab 03/11/20 2022 03/12/20 0437 03/13/20 0319 03/14/20 0306 03/15/20 0332  NA 135 135 136 138 136  K 3.8 4.1 3.3* 4.0 3.3*  CL 100 103 104 110 106  CO2 25 25 23 22 24   GLUCOSE 119* 123* 118* 111* 128*  BUN 19 17 15 8  7*  CREATININE 1.44* 1.30* 1.34* 1.27* 1.26*  CALCIUM 8.8* 8.6* 8.1* 8.3* 8.2*  MG  --   --  2.1  --   --    GFR: Estimated Creatinine Clearance: 46.8 mL/min (A) (by C-G formula  based on SCr of 1.26 mg/dL (H)). Liver Function Tests: Recent Labs  Lab 03/11/20 2022 03/12/20 0437 03/13/20 0319 03/14/20 0306 03/15/20 0332  AST 21 19 25 25 20   ALT 14 13 13 14 12   ALKPHOS 86 80 89 92 81  BILITOT 2.7* 3.1* 2.4* 1.8* 1.4*  PROT 7.4 6.9 6.4* 6.5 6.2*  ALBUMIN 3.8 3.4* 3.2* 3.0* 2.9*   Recent Labs  Lab 03/11/20 2022 03/15/20 0332  LIPASE 20 1,044*   No results for input(s): AMMONIA in the last 168 hours. Coagulation Profile: Recent Labs  Lab 03/14/20 0306  INR 1.2   Cardiac Enzymes: No results for input(s): CKTOTAL, CKMB, CKMBINDEX, TROPONINI in the last 168 hours. BNP (last 3 results) No results for input(s): PROBNP in the last 8760 hours. HbA1C: No results for input(s): HGBA1C in the last 72 hours. CBG: No results for input(s): GLUCAP in the last 168 hours. Lipid Profile:  No results for input(s): CHOL, HDL, LDLCALC, TRIG, CHOLHDL, LDLDIRECT in the last 72 hours. Thyroid Function Tests: No results for input(s): TSH, T4TOTAL, FREET4, T3FREE, THYROIDAB in the last 72 hours. Anemia Panel: No results for input(s): VITAMINB12, FOLATE, FERRITIN, TIBC, IRON, RETICCTPCT in the last 72 hours.    Radiology Studies: I have reviewed all of the imaging during this hospital visit personally     Scheduled Meds: . indomethacin  100 mg Rectal Once  . levothyroxine  75 mcg Oral Q0600  . pantoprazole  40 mg Oral Daily  . simvastatin  40 mg Oral q1800   Continuous Infusions: . dextrose 5% lactated ringers 75 mL/hr at 03/15/20 1300  . piperacillin-tazobactam (ZOSYN)  IV 3.375 g (03/15/20 1313)     LOS: 3 days        Kirstine Jacquin Gerome Apley, MD

## 2020-03-15 NOTE — Plan of Care (Signed)

## 2020-03-15 NOTE — Plan of Care (Signed)
  Problem: Clinical Measurements: Goal: Respiratory complications will improve Outcome: Progressing   Problem: Clinical Measurements: Goal: Cardiovascular complication will be avoided Outcome: Progressing   Problem: Activity: Goal: Risk for activity intolerance will decrease Outcome: Progressing   Problem: Nutrition: Goal: Adequate nutrition will be maintained Outcome: Progressing   Problem: Coping: Goal: Level of anxiety will decrease Outcome: Progressing   

## 2020-03-15 NOTE — Progress Notes (Signed)
1 Day Post-Op   Subjective/Chief Complaint:  Mad about not having surgery   S/p ERCP but lipase 1000 this am consisttent with post procedure pancreatitis  Some epigastric discomfort and distention  Objective: Vital signs in last 24 hours: Temp:  [97.6 F (36.4 C)-99 F (37.2 C)] 97.8 F (36.6 C) (06/19 0936) Pulse Rate:  [41-60] 43 (06/19 0936) Resp:  [14-19] 16 (06/19 0936) BP: (118-196)/(68-98) 118/68 (06/19 0936) SpO2:  [94 %-100 %] 95 % (06/19 0936) Weight:  [78.9 kg] 78.9 kg (06/18 1207) Last BM Date: 03/15/20  Intake/Output from previous day: 06/18 0701 - 06/19 0700 In: 3064 [P.O.:480; I.V.:2481.4; IV Piggyback:102.6] Out: 685 [Urine:675; Blood:10] Intake/Output this shift: Total I/O In: 298.6 [I.V.:257.6; IV Piggyback:41.1] Out: -   GI: epigastric discomfort mild distentionno peritonitis   Lab Results:  Recent Labs    03/14/20 0306 03/15/20 0332  WBC 7.0 8.7  HGB 12.3* 12.0*  HCT 37.9* 36.5*  PLT 190 183   BMET Recent Labs    03/14/20 0306 03/15/20 0332  NA 138 136  K 4.0 3.3*  CL 110 106  CO2 22 24  GLUCOSE 111* 128*  BUN 8 7*  CREATININE 1.27* 1.26*  CALCIUM 8.3* 8.2*   PT/INR Recent Labs    03/14/20 0306  LABPROT 14.3  INR 1.2   ABG No results for input(s): PHART, HCO3 in the last 72 hours.  Invalid input(s): PCO2, PO2  Studies/Results: DG ERCP BILIARY & PANCREATIC DUCTS  Result Date: 03/14/2020 CLINICAL DATA:  79 year old male with choledocholithiasis EXAM: ERCP TECHNIQUE: Multiple spot images obtained with the fluoroscopic device and submitted for interpretation post-procedure. FLUOROSCOPY TIME:  Fluoroscopy Time:  3 minutes 8 seconds COMPARISON:  None. FINDINGS: A total of 15 intraoperative saved images are submitted for review. The images demonstrate a flexible duodenal scope in the descending duodenum followed by wire cannulation of the common bile duct, sphincterotomy, and balloon sweeping of the common duct. Initial cholangiography  demonstrates some evidence of filling defects in the distal common duct consistent with stones. IMPRESSION: ERCP with sphincterotomy and balloon sweeping of the common duct. These images were submitted for radiologic interpretation only. Please see the procedural report for the amount of contrast and the fluoroscopy time utilized. Electronically Signed   By: Malachy Moan M.D.   On: 03/14/2020 15:00    Anti-infectives: Anti-infectives (From admission, onward)   Start     Dose/Rate Route Frequency Ordered Stop   03/12/20 0600  piperacillin-tazobactam (ZOSYN) IVPB 3.375 g     Discontinue     3.375 g 12.5 mL/hr over 240 Minutes Intravenous Every 8 hours 03/12/20 0451     03/11/20 2315  piperacillin-tazobactam (ZOSYN) IVPB 3.375 g        3.375 g 100 mL/hr over 30 Minutes Intravenous  Once 03/11/20 2309 03/12/20 0005      Assessment/Plan: s/p Procedure(s) with comments: ENDOSCOPIC RETROGRADE CHOLANGIOPANCREATOGRAPHY (ERCP) (N/A) UPPER ENDOSCOPIC ULTRASOUND (EUS) LINEAR BIOPSY SPHINCTEROTOMY - stone sludge removal BILIARY DILATION    Hold on lap chole until lipase trends dow due to pancreatitis Re check in am  If down, proceed with LGB Sunday  Ok to have ice chips   LOS: 3 days    Larry Salazar A Larry Salazar 03/15/2020

## 2020-03-16 ENCOUNTER — Inpatient Hospital Stay (HOSPITAL_COMMUNITY): Payer: Medicare Other | Admitting: Certified Registered Nurse Anesthetist

## 2020-03-16 ENCOUNTER — Encounter (HOSPITAL_COMMUNITY)
Disposition: A | Discharge status: Home / Self Care | Payer: Self-pay | Source: Home / Self Care | Attending: Internal Medicine

## 2020-03-16 HISTORY — PX: CHOLECYSTECTOMY: SHX55

## 2020-03-16 LAB — CBC WITH DIFFERENTIAL/PLATELET
Abs Immature Granulocytes: 0.01 10*3/uL (ref 0.00–0.07)
Basophils Absolute: 0 10*3/uL (ref 0.0–0.1)
Basophils Relative: 1 %
Eosinophils Absolute: 0.4 10*3/uL (ref 0.0–0.5)
Eosinophils Relative: 5 %
HCT: 36 % — ABNORMAL LOW (ref 39.0–52.0)
Hemoglobin: 11.6 g/dL — ABNORMAL LOW (ref 13.0–17.0)
Immature Granulocytes: 0 %
Lymphocytes Relative: 19 %
Lymphs Abs: 1.4 10*3/uL (ref 0.7–4.0)
MCH: 31.4 pg (ref 26.0–34.0)
MCHC: 32.2 g/dL (ref 30.0–36.0)
MCV: 97.6 fL (ref 80.0–100.0)
Monocytes Absolute: 0.8 10*3/uL (ref 0.1–1.0)
Monocytes Relative: 11 %
Neutro Abs: 4.7 10*3/uL (ref 1.7–7.7)
Neutrophils Relative %: 64 %
Platelets: 179 10*3/uL (ref 150–400)
RBC: 3.69 MIL/uL — ABNORMAL LOW (ref 4.22–5.81)
RDW: 12.4 % (ref 11.5–15.5)
WBC: 7.3 10*3/uL (ref 4.0–10.5)
nRBC: 0 % (ref 0.0–0.2)

## 2020-03-16 LAB — MAGNESIUM: Magnesium: 1.9 mg/dL (ref 1.7–2.4)

## 2020-03-16 LAB — BASIC METABOLIC PANEL
Anion gap: 5 (ref 5–15)
BUN: 6 mg/dL — ABNORMAL LOW (ref 8–23)
CO2: 24 mmol/L (ref 22–32)
Calcium: 8.2 mg/dL — ABNORMAL LOW (ref 8.9–10.3)
Chloride: 108 mmol/L (ref 98–111)
Creatinine, Ser: 1.42 mg/dL — ABNORMAL HIGH (ref 0.61–1.24)
GFR calc Af Amer: 54 mL/min — ABNORMAL LOW (ref >60)
GFR calc non Af Amer: 47 mL/min — ABNORMAL LOW (ref >60)
Glucose, Bld: 189 mg/dL — ABNORMAL HIGH (ref 70–99)
Potassium: 4.2 mmol/L (ref 3.5–5.1)
Sodium: 137 mmol/L (ref 135–145)

## 2020-03-16 LAB — LIPASE, BLOOD: Lipase: 113 U/L — ABNORMAL HIGH (ref 11–51)

## 2020-03-16 SURGERY — LAPAROSCOPIC CHOLECYSTECTOMY WITH INTRAOPERATIVE CHOLANGIOGRAM
Anesthesia: General | Site: Abdomen

## 2020-03-16 MED ORDER — TRAMADOL HCL 50 MG PO TABS: 50.0000 mg | ORAL_TABLET | Freq: Four times a day (QID) | ORAL | 0 refills | Status: AC | PRN

## 2020-03-16 MED ORDER — BUPIVACAINE HCL 0.25 % IJ SOLN
INTRAMUSCULAR | Status: DC | PRN
  Administered 2020-03-16: 20 mL

## 2020-03-16 MED ORDER — LIDOCAINE 2% (20 MG/ML) 5 ML SYRINGE
INTRAMUSCULAR | Status: DC | PRN
  Administered 2020-03-16: 50 mg via INTRAVENOUS

## 2020-03-16 MED ORDER — FENTANYL CITRATE (PF) 100 MCG/2ML IJ SOLN
INTRAMUSCULAR | Status: DC | PRN
  Administered 2020-03-16: 100 ug via INTRAVENOUS
  Administered 2020-03-16 (×2): 50 ug via INTRAVENOUS

## 2020-03-16 MED ORDER — ONDANSETRON HCL 4 MG/2ML IJ SOLN
INTRAMUSCULAR | Status: AC
  Filled 2020-03-16: qty 2

## 2020-03-16 MED ORDER — MAGNESIUM SULFATE 2 GM/50ML IV SOLN
2.0000 g | Freq: Once | INTRAVENOUS | Status: AC
  Administered 2020-03-16: 2 g via INTRAVENOUS
  Filled 2020-03-16: qty 50

## 2020-03-16 MED ORDER — MEPERIDINE HCL 50 MG/ML IJ SOLN: 6.2500 mg | INTRAMUSCULAR | Status: DC | PRN

## 2020-03-16 MED ORDER — FENTANYL CITRATE (PF) 100 MCG/2ML IJ SOLN
INTRAMUSCULAR | Status: AC
  Filled 2020-03-16: qty 2

## 2020-03-16 MED ORDER — HYDROMORPHONE HCL 1 MG/ML IJ SOLN
0.2500 mg | INTRAMUSCULAR | Status: DC | PRN
  Administered 2020-03-16 (×2): 0.5 mg via INTRAVENOUS

## 2020-03-16 MED ORDER — PROPOFOL 10 MG/ML IV BOLUS
INTRAVENOUS | Status: AC
  Filled 2020-03-16: qty 20

## 2020-03-16 MED ORDER — PROPOFOL 10 MG/ML IV BOLUS
INTRAVENOUS | Status: DC | PRN
  Administered 2020-03-16: 150 mg via INTRAVENOUS

## 2020-03-16 MED ORDER — ROCURONIUM BROMIDE 10 MG/ML (PF) SYRINGE
PREFILLED_SYRINGE | INTRAVENOUS | Status: AC
  Filled 2020-03-16: qty 10

## 2020-03-16 MED ORDER — HYDROMORPHONE HCL 1 MG/ML IJ SOLN
INTRAMUSCULAR | Status: AC
  Filled 2020-03-16: qty 1

## 2020-03-16 MED ORDER — LIDOCAINE 2% (20 MG/ML) 5 ML SYRINGE
INTRAMUSCULAR | Status: AC
  Filled 2020-03-16: qty 5

## 2020-03-16 MED ORDER — ROCURONIUM BROMIDE 10 MG/ML (PF) SYRINGE
PREFILLED_SYRINGE | INTRAVENOUS | Status: DC | PRN
  Administered 2020-03-16: 80 mg via INTRAVENOUS

## 2020-03-16 MED ORDER — ONDANSETRON HCL 4 MG/2ML IJ SOLN
4.0000 mg | Freq: Once | INTRAMUSCULAR | Status: AC | PRN
  Administered 2020-03-16: 4 mg via INTRAVENOUS

## 2020-03-16 MED ORDER — LACTATED RINGERS IV SOLN: INTRAVENOUS | Status: DC | PRN

## 2020-03-16 MED ORDER — DEXAMETHASONE SODIUM PHOSPHATE 10 MG/ML IJ SOLN
INTRAMUSCULAR | Status: AC
  Filled 2020-03-16: qty 1

## 2020-03-16 MED ORDER — BUPIVACAINE HCL 0.25 % IJ SOLN
INTRAMUSCULAR | Status: AC
  Filled 2020-03-16: qty 1

## 2020-03-16 MED ORDER — SUGAMMADEX SODIUM 200 MG/2ML IV SOLN
INTRAVENOUS | Status: DC | PRN
  Administered 2020-03-16: 157.8 mg via INTRAVENOUS

## 2020-03-16 MED ORDER — LACTATED RINGERS IV SOLN
INTRAVENOUS | Status: AC | PRN
  Administered 2020-03-16: 1000 mL

## 2020-03-16 MED ORDER — 0.9 % SODIUM CHLORIDE (POUR BTL) OPTIME
TOPICAL | Status: DC | PRN
  Administered 2020-03-16: 1000 mL

## 2020-03-16 MED ORDER — DEXAMETHASONE SODIUM PHOSPHATE 10 MG/ML IJ SOLN
INTRAMUSCULAR | Status: DC | PRN
  Administered 2020-03-16: 8 mg via INTRAVENOUS

## 2020-03-16 SURGICAL SUPPLY — 47 items
ADH SKN CLS APL DERMABOND .7 (GAUZE/BANDAGES/DRESSINGS) ×1
APL PRP STRL LF DISP 70% ISPRP (MISCELLANEOUS) ×1
APL SRG 38 LTWT LNG FL B (MISCELLANEOUS)
APPLICATOR ARISTA FLEXITIP XL (MISCELLANEOUS) IMPLANT
APPLIER CLIP 5 13 M/L LIGAMAX5 (MISCELLANEOUS)
APPLIER CLIP ROT 10 11.4 M/L (STAPLE)
APR CLP MED LRG 11.4X10 (STAPLE)
APR CLP MED LRG 5 ANG JAW (MISCELLANEOUS)
BAG SPEC RTRVL LRG 6X4 10 (ENDOMECHANICALS) ×1
CABLE HIGH FREQUENCY MONO STRZ (ELECTRODE) ×2 IMPLANT
CHLORAPREP W/TINT 26 (MISCELLANEOUS) ×2 IMPLANT
CLIP APPLIE 5 13 M/L LIGAMAX5 (MISCELLANEOUS) ×1 IMPLANT
CLIP APPLIE ROT 10 11.4 M/L (STAPLE) IMPLANT
COVER MAYO STAND STRL (DRAPES) IMPLANT
COVER SURGICAL LIGHT HANDLE (MISCELLANEOUS) ×2 IMPLANT
COVER WAND RF STERILE (DRAPES) IMPLANT
DECANTER SPIKE VIAL GLASS SM (MISCELLANEOUS) ×2 IMPLANT
DERMABOND ADVANCED (GAUZE/BANDAGES/DRESSINGS) ×1
DERMABOND ADVANCED .7 DNX12 (GAUZE/BANDAGES/DRESSINGS) ×1 IMPLANT
DISSECTOR BLUNT TIP ENDO 5MM (MISCELLANEOUS) IMPLANT
DRAPE C-ARM 42X120 X-RAY (DRAPES) IMPLANT
ELECT PENCIL ROCKER SW 15FT (MISCELLANEOUS) ×2 IMPLANT
ELECT REM PT RETURN 15FT ADLT (MISCELLANEOUS) ×2 IMPLANT
GLOVE BIO SURGEON STRL SZ7.5 (GLOVE) ×2 IMPLANT
GLOVE INDICATOR 8.0 STRL GRN (GLOVE) ×2 IMPLANT
GOWN STRL REUS W/TWL XL LVL3 (GOWN DISPOSABLE) ×4 IMPLANT
GRASPER SUT TROCAR 14GX15 (MISCELLANEOUS) IMPLANT
HEMOSTAT ARISTA ABSORB 3G PWDR (HEMOSTASIS) IMPLANT
HEMOSTAT SNOW SURGICEL 2X4 (HEMOSTASIS) IMPLANT
KIT BASIN (CUSTOM PROCEDURE TRAY) ×2 IMPLANT
NDL INSUFFLATION 14GA 120MM (NEEDLE) IMPLANT
NEEDLE INSUFFLATION 14GA 120MM (NEEDLE) IMPLANT
POUCH SPECIMEN RETRIEVAL 10MM (ENDOMECHANICALS) ×2 IMPLANT
SCISSORS LAP 5X35 DISP (ENDOMECHANICALS) ×2 IMPLANT
SET CHOLANGIOGRAPH MIX (MISCELLANEOUS) IMPLANT
SET IRRIG TUBING LAPAROSCOPIC (IRRIGATION / IRRIGATOR) ×2 IMPLANT
SET TUBE SMOKE EVAC HIGH FLOW (TUBING) ×2 IMPLANT
SLEEVE ADV FIXATION 5X100MM (TROCAR) ×4 IMPLANT
SUT MNCRL AB 4-0 PS2 18 (SUTURE) ×2 IMPLANT
SYR 10ML ECCENTRIC (SYRINGE) ×2 IMPLANT
TOWEL OR 17X26 10 PK STRL BLUE (TOWEL DISPOSABLE) ×2 IMPLANT
TOWEL OR NON WOVEN STRL DISP B (DISPOSABLE) IMPLANT
TRAY LAPAROSCOPIC (CUSTOM PROCEDURE TRAY) ×2 IMPLANT
TROCAR ADV FIXATION 12X100MM (TROCAR) ×1 IMPLANT
TROCAR ADV FIXATION 5X100MM (TROCAR) ×2 IMPLANT
TROCAR XCEL BLUNT TIP 100MML (ENDOMECHANICALS) ×2 IMPLANT
TROCAR XCEL NON-BLD 11X100MML (ENDOMECHANICALS) IMPLANT

## 2020-03-16 NOTE — Progress Notes (Signed)
Subjective No acute events. Feeling well. Denies abdominal pain at this time. Reports the MEG pain he had yesterday has resolved.  Objective: Vital signs in last 24 hours: Temp:  [97.5 F (36.4 C)-98.2 F (36.8 C)] 97.5 F (36.4 C) (06/20 0436) Pulse Rate:  [43-57] 54 (06/20 0436) Resp:  [14-16] 16 (06/20 0436) BP: (118-149)/(68-83) 137/83 (06/20 0436) SpO2:  [95 %-98 %] 97 % (06/20 0436) Last BM Date: 03/15/20  Intake/Output from previous day: 06/19 0701 - 06/20 0700 In: 3959.8 [P.O.:180; I.V.:3625.5; IV Piggyback:154.4] Out: 650 [Urine:650] Intake/Output this shift: No intake/output data recorded.  Gen: NAD, comfortable CV: RRR Pulm: Normal work of breathing Abd: Soft, NT/ND Ext: SCDs in place  Lab Results: CBC  Recent Labs    03/15/20 0332 03/16/20 0304  WBC 8.7 7.3  HGB 12.0* 11.6*  HCT 36.5* 36.0*  PLT 183 179   BMET Recent Labs    03/15/20 0332 03/16/20 0304  NA 136 137  K 3.3* 4.2  CL 106 108  CO2 24 24  GLUCOSE 128* 189*  BUN 7* 6*  CREATININE 1.26* 1.42*  CALCIUM 8.2* 8.2*   PT/INR Recent Labs    03/14/20 0306  LABPROT 14.3  INR 1.2   ABG No results for input(s): PHART, HCO3 in the last 72 hours.  Invalid input(s): PCO2, PO2  Studies/Results:  Anti-infectives: Anti-infectives (From admission, onward)   Start     Dose/Rate Route Frequency Ordered Stop   03/12/20 0600  piperacillin-tazobactam (ZOSYN) IVPB 3.375 g     Discontinue     3.375 g 12.5 mL/hr over 240 Minutes Intravenous Every 8 hours 03/12/20 0451     03/11/20 2315  piperacillin-tazobactam (ZOSYN) IVPB 3.375 g        3.375 g 100 mL/hr over 30 Minutes Intravenous  Once 03/11/20 2309 03/12/20 0005       Assessment/Plan: Patient Active Problem List   Diagnosis Date Noted  . Choledocholithiasis   . RUQ pain   . Abdominal pain, epigastric   . Acute cholecystitis 03/11/2020  . Rheumatoid arthritis, seronegative, multiple sites (HCC) 05/05/2017  . High risk medications   long-term use 05/05/2017  . Needle phobia 05/05/2017  . History of renal insufficiency 05/05/2017  . Primary osteoarthritis of both hands 05/05/2017  . Primary osteoarthritis of both feet 05/05/2017  . History of biceps tendon repair  05/05/2017  . History of hypothyroidism 05/05/2017  . History of hyperlipidemia 05/05/2017  . Former smoker Quit 2017  05/05/2017   -The anatomy and physiology of the hepatobiliary system was discussed at length with the patient. The pathophysiology of gallbladder disease was discussed at length as well. -The options for treatment were discussed including ongoing observation which may result in subsequent gallbladder complications (infection, recurrent pancreatitis, choledocholithiasis, etc) as well as surgery - cholecystectomy - laparoscopic and potential open techniques -The planned procedure, material risks (including, but not limited to, pain, bleeding, infection, scarring, need for blood transfusion, damage to surrounding structures- blood vessels/nerves/viscus/organs, damage to bile duct, bile leak, need for additional procedures, hernia, worsening of pre-existing medical conditions, pancreatitis, pneumonia, heart attack, stroke, death) benefits and alternatives to surgery were discussed at length. I noted a good probability that the procedure would help improve his symptoms. The patient's questions were answered to his satisfaction, he voiced understanding and they elected to proceed with surgery. Additionally, we discussed typical postoperative expectations and the recovery process.   LOS: 4 days   Stephanie Coup. Cliffton Asters, M.D. [facility] Use [email or web address] to  contact on call provider

## 2020-03-16 NOTE — Plan of Care (Signed)
  Problem: Clinical Measurements: Goal: Respiratory complications will improve Outcome: Progressing   Problem: Clinical Measurements: Goal: Cardiovascular complication will be avoided Outcome: Progressing   Problem: Activity: Goal: Risk for activity intolerance will decrease Outcome: Progressing   Problem: Coping: Goal: Level of anxiety will decrease Outcome: Progressing   Problem: Elimination: Goal: Will not experience complications related to bowel motility Outcome: Progressing   

## 2020-03-16 NOTE — Discharge Instructions (Signed)
POST OP INSTRUCTIONS  1. DIET: As tolerated. Follow a light bland diet the first 24 hours after arrival home, such as soup, liquids, crackers, etc.  Be sure to include lots of fluids daily.  Avoid fast food or heavy meals as your are more likely to get nauseated.  Eat a low fat the next few days after surgery.  2. Take your usually prescribed home medications unless otherwise directed.  3. PAIN CONTROL: a. Pain is best controlled by a usual combination of three different methods TOGETHER: i. Ice/Heat ii. Over the counter pain medication iii. Prescription pain medication b. Most patients will experience some swelling and bruising around the surgical site.  Ice packs or heating pads (30-60 minutes up to 6 times a day) will help. Some people prefer to use ice alone, heat alone, alternating between ice & heat.  Experiment to what works for you.  Swelling and bruising can take several weeks to resolve.   c. It is helpful to take an over-the-counter pain medication regularly for the first few weeks: i. Ibuprofen (Motrin/Advil) - 200mg  tabs - take 3 tabs (600mg ) every 6 hours as needed for pain ii. Acetaminophen (Tylenol) - you may take 650mg  every 6 hours as needed. You can take this with motrin as they act differently on the body. If you are taking a narcotic pain medication that has acetaminophen in it, do not take over the counter tylenol at the same time.  Iii. NOTE: You may take both of these medications together - most patients  find it most helpful when alternating between the two (i.e. Ibuprofen at 6am, tylenol at 9am, ibuprofen at 12pm ...) d. A  prescription for pain medication should be given to you upon discharge.  Take your pain medication as prescribed if your pain is not adequatly controlled with the over-the-counter pain reliefs mentioned above.  4. Avoid getting constipated.  Between the surgery and the pain medications, it is common to experience some constipation.  Increasing fluid  intake and taking a fiber supplement (such as Metamucil, Citrucel, FiberCon, MiraLax, etc) 1-2 times a day regularly will usually help prevent this problem from occurring.  A mild laxative (prune juice, Milk of Magnesia, MiraLax, etc) should be taken according to package directions if there are no bowel movements after 48 hours.    5. Dressing: Your incisions are covered in Dermabond which is like sterile superglue for the skin. This will come off on it's own in a couple weeks. It is waterproof and you may bathe normally starting the day after your surgery in a shower. Avoid baths/pools/lakes/oceans until your wounds have fully healed.  6. ACTIVITIES as tolerated:   a. Avoid heavy lifting (>10lbs or 1 gallon of milk) for the next 6 weeks. b. You may resume regular (light) daily activities beginning the next day--such as daily self-care, walking, climbing stairs--gradually increasing activities as tolerated.  If you can walk 30 minutes without difficulty, it is safe to try more intense activity such as jogging, treadmill, bicycling, low-impact aerobics.  c. DO NOT PUSH THROUGH PAIN.  Let pain be your guide: If it hurts to do something, don't do it. d. Dennis Bast may drive when you are no longer taking prescription pain medication, you can comfortably wear a seatbelt, and you can safely maneuver your car and apply brakes.   7. FOLLOW UP in our office a. Please call [facility] at [phone] to set up an appointment to see your surgeon in the office for a follow-up appointment  approximately 2 weeks after your surgery. b. Make sure that you call for this appointment the day you arrive home to insure a convenient appointment time.  9. If you have disability or family leave forms that need to be completed, you may have them completed by your primary care physician's office; for return to work instructions, please ask our office staff and they will be happy to assist you in obtaining this documentation   When to call  us [phone]: 1. Poor pain control 2. Reactions / problems with new medications (rash/itching, etc)  3. Fever over 101.5 F (38.5 C) 4. Inability to urinate 5. Nausea/vomiting 6. Worsening swelling or bruising 7. Continued bleeding from incision. 8. Increased pain, redness, or drainage from the incision  The clinic staff is available to answer your questions during regular business hours (8:30am-5pm).  Please don't hesitate to call and ask to speak to one of our nurses for clinical concerns.   A surgeon from [facility] is always on call at the hospitals   If you have a medical emergency, go to the nearest emergency room or call 911.  [facility] [address], [place], [place]  [address] MAIN: [phone] FAX: [phone] [email or web address]    Endoscopic Retrograde Cholangiopancreatogram, Care After This sheet gives you information about how to care for yourself after your procedure. Your health care provider may also give you more specific instructions. If you have problems or questions, contact your health care provider. What can I expect after the procedure? After the procedure, it is common to have:  Soreness in your throat.  Nausea.  Bloating.  Dizziness.  Tiredness (fatigue). Follow these instructions at home:   Take over-the-counter and prescription medicines only as told by your health care provider.  Do not drive for 24 hours if you were given a medicine to help you relax (sedative) during your procedure. Have someone stay with you for 24 hours after the procedure.  Return to your normal activities as told by your health care provider. Ask your health care provider what activities are safe for you.  Return to eating what you normally do as soon as you feel well enough or as told by your health care provider.  Keep all follow-up visits as told by your health care provider. This is  important. Contact a health care provider if:  You have pain in your abdomen that does not get better with medicine.  You develop signs of infection, such as: ? Chills. ? Feeling unwell. Get help right away if:  You have difficulty swallowing.  You have worsening pain in your throat, chest, or abdomen.  You vomit bright red blood or a substance that looks like coffee grounds.  You have bloody or very black stools.  You have a fever.  You have a sudden increase in swelling (bloating) in your abdomen. Summary  After the procedure, it is common to feel tired and to have some discomfort in your throat.  Contact your health care provider if you have signs of infection--such as chills or feeling unwell--or if you have pain that does not improve with medicine.  Get help right away if you have trouble swallowing, worsening pain, bloody or black vomit, bloody or black stools, a fever, or increased swelling in your abdomen.  Keep all follow-up visits as told by your health care provider. This is important. This information is not intended to replace advice given to you by your health  care provider. Make sure you discuss any questions you have with your health care provider. Document Revised: 08/26/2017 Document Reviewed: 08/02/2016 Elsevier Patient Education  2020 [employer].  [facility] ______[facility] LAPAROSCOPIC SURGERY: POST OP INSTRUCTIONS Always review your discharge instruction sheet given to you by the facility where your surgery was performed. IF YOU HAVE DISABILITY OR FAMILY LEAVE FORMS, YOU MUST BRING THEM TO THE OFFICE FOR PROCESSING.   DO NOT GIVE THEM TO YOUR DOCTOR.  8. A prescription for pain medication may be given to you upon discharge.  Take your pain medication as prescribed, if needed.  If narcotic pain medicine is not needed, then you may take acetaminophen (Tylenol) or ibuprofen (Advil) as needed. 9. Take your usually prescribed medications  unless otherwise directed. 10. If you need a refill on your pain medication, please contact your pharmacy.  They will contact our office to request authorization. Prescriptions will not be filled after 5pm or on week-ends. 11. You should follow a light diet the first few days after arrival home, such as soup and crackers, etc.  Be sure to include lots of fluids daily. 12. Most patients will experience some swelling and bruising in the area of the incisions.  Ice packs will help.  Swelling and bruising can take several days to resolve.  13. It is common to experience some constipation if taking pain medication after surgery.  Increasing fluid intake and taking a stool softener (such as Colace) will usually help or prevent this problem from occurring.  A mild laxative (Milk of Magnesia or Miralax) should be taken according to package instructions if there are no bowel movements after 48 hours. 14. Unless discharge instructions indicate otherwise, you may remove your bandages 24-48 hours after surgery, and you may shower at that time.  You may have steri-strips (small skin tapes) in place directly over the incision.  These strips should be left on the skin for 7-10 days.  If your surgeon used skin glue on the incision, you may shower in 24 hours.  The glue will flake off over the next 2-3 weeks.  Any sutures or staples will be removed at the office during your follow-up visit. 15. ACTIVITIES:  You may resume regular (light) daily activities beginning the next day--such as daily self-care, walking, climbing stairs--gradually increasing activities as tolerated.  You may have sexual intercourse when it is comfortable.  Refrain from any heavy lifting or straining until approved by your doctor. a. You may drive when you are no longer taking prescription pain medication, you can comfortably wear a seatbelt, and you can safely maneuver your car and apply brakes. b. RETURN TO WORK:   __________________________________________________________ 16. You should see your doctor in the office for a follow-up appointment approximately 2-3 weeks after your surgery.  Make sure that you call for this appointment within a day or two after you arrive home to insure a convenient appointment time. 17. OTHER INSTRUCTIONS: __________________________________________________________________________________________________________________________ __________________________________________________________________________________________________________________________ WHEN TO CALL YOUR DOCTOR: 9. Fever over 101.0 10. Inability to urinate 11. Continued bleeding from incision. 12. Increased pain, redness, or drainage from the incision. 13. Increasing abdominal pain  The clinic staff is available to answer your questions during regular business hours.  Please don't hesitate to call and ask to speak to one of the nurses for clinical concerns.  If you have a medical emergency, go to the nearest emergency room or call 911.  A surgeon from [facility] is always on call at the  hospital. [address], [place], [place]  [address] ? [address] [address], [place], [place]   [address] [phone] ? [phone] ? FAX [phone] Web site: [email or web address]

## 2020-03-16 NOTE — Anesthesia Postprocedure Evaluation (Signed)
Anesthesia Post Note  Patient: Larry Salazar  Procedure(s) Performed: LAPAROSCOPIC CHOLECYSTECTOMY WITH INTRAOPERATIVE CHOLANGIOGRAM (N/A Abdomen)     Patient location during evaluation: PACU Anesthesia Type: General Level of consciousness: awake and alert Pain management: pain level controlled Vital Signs Assessment: post-procedure vital signs reviewed and stable Respiratory status: spontaneous breathing, nonlabored ventilation, respiratory function stable and patient connected to nasal cannula oxygen Cardiovascular status: blood pressure returned to baseline and stable Postop Assessment: no apparent nausea or vomiting Anesthetic complications: no   No complications documented.  Last Vitals:  Vitals:   03/16/20 0919 03/16/20 0921  BP: 137/78   Pulse: 69   Resp: 17 14  Temp: 36.9 C   SpO2: 98%     Last Pain:  Vitals:   03/16/20 0919  TempSrc:   PainSc: 0-No pain                 Jiovanni Heeter Kyran

## 2020-03-16 NOTE — Op Note (Signed)
03/11/2020 - 03/16/2020 9:07 AM  PATIENT: Larry Salazar  79 y.o. male  Patient Care Team: Heron Nay, PA as PCP - General (Physician Assistant)  PRE-OPERATIVE DIAGNOSIS: 1. Choledocholithiasis + cholecystitis 2. Post-ERCP pancreatitis  POST-OPERATIVE DIAGNOSIS: Same + chronic cholecystitis  PROCEDURE: Laparoscopic cholecystectomy  SURGEON: Stephanie Coup. Nijee Heatwole, MD  ASSISTANT: OR staff  ANESTHESIA: General endotracheal  EBL: Total I/O In: -  Out: 10 [Blood:10]  DRAINS: None  SPECIMEN: Gallbladder  COUNTS: Sponge, needle and instrument counts were reported correct x2 at the conclusion of the operation  DISPOSITION: PACU in satisfactory condition  COMPLICATIONS: None  FINDINGS: Gallbladder completely encased with omentum.  This was all carefully freed.  Acute on chronic inflammation.  Thickened, fibrotic tissues but a critical view of safety was able to be achieved prior to clipping or dividing any structures.  INDICATION: Larry Salazar is a very pleasant 79yo M who had presented to the hospital with acute onset of abdominal pain in his right upper quadrant/midepigastrium.  He was seen and evaluated.  He was found to have elevated liver enzymes.  He was also seen by gastroenterology.  He had undergone CT abdomen pelvis as well as ultimately an MRCP.  He had findings concerning for choledocholithiasis.  He was taken for ERCP 03/14/2020 where his duct was cleared of stones and debris.  He was found to have an occluded cystic duct on ERCP.  He had been on antibiotics.  The day following his procedure, he did have midepigastric pain that was a bit different from previously and a lipase of 1000.  The following day (today) his abdominal pain had virtually resolved.  His lipase was near normal.  We discussed options going forward and he opted to proceed with surgery.  Please refer to notes elsewhere for details regarding this discussion.  DESCRIPTION:   The patient was identified &  brought into the operating room. He was then positioned supine on the OR table. SCDs were in place and active during the entire case. He then underwent general endotracheal anesthesia. Pressure points were padded. Hair on the abdomen was clipped by the OR team. The abdomen was prepped and draped in the standard sterile fashion. Antibiotics were administered. A surgical timeout was performed and confirmed our plan.   An infraumbilical incision was made. The umbilical stalk was grasped and retracted outwardly. The infraumbilical fascia was identified and incised. The peritoneal cavity was gently entered bluntly. A purse-string 0 Vicryl suture was placed. The Hasson cannula was inserted into the peritoneal cavity and insufflation with CO2 commenced to . A laparoscope was inserted into the peritoneal cavity and inspection confirmed no evidence of trocar site complications. The patient was then positioned in reverse Trendelenburg with slight left side down. 3 additional 14mm trocars were placed along the right subcostal line - one 67mm port in mid subcostal region, another 30mm port in the right flank near the anterior axillary line, and a third 58mm port in the left subxiphoid region obliquely near the falciform ligament.  The liver and gallbladder were inspected.  The gallbladder is not visible on initial inspection as it was coated and omentum.  There was some fibrotic adhesions of the omentum to the gallbladder.  These were carefully freed using electrocautery.  The omentum was then swept off the body of the gallbladder.  The pylorus/D1 was also carefully swept away bluntly.  He did have fatty liver changes with a mildly nodular contour of his liver. The gallbladder fundus was grasped and elevated  cephalad. An additional grasper was then placed on the infundibulum of the gallbladder and the infundibulum was retracted laterally. Staying high on the gallbladder, the peritoneum on both sides of the gallbladder was  opened with hook cautery. Gentle blunt dissection was then employed with a IT consultant working down into [employer].  There were fibrotic adhesions consistent with chronic cholecystitis.  These were cautiously dissected.  The cystic duct was identified and carefully circumferentially dissected. The cystic artery was also identified and carefully circumferentially dissected. The space between the cystic artery and hepatocystic plate was developed such that a good view of the liver could be seen through a window medial to the cystic artery. The triangle of Calot had been cleared of all fibrofatty tissue. At this point, a critical view of safety was achieved and the only structures visualized was the skeletonized cystic duct laterally, the skeletonized cystic artery and the liver through the window medial to the artery. No posterior cystic artery was noted  A cholangiogram was not performed at this point as he had friable and fragile tissues and there was concern that with a partial ductotomy, the cystic duct could completely tear.   The cystic duct and artery were clipped with 2 clips on the patient side and 1 clip on the specimen side. The cystic duct and artery were then divided. The gallbladder was then freed from its remaining attachments to the liver using electrocautery and placed into an endocatch bag. The RUQ was gently irrigated with sterile saline. Hemostasis was then verified. The clips were in good position; the gallbladder fossa was dry. The rest of the abdomen was inspected no injury nor bleeding elsewhere was identified.  The endocatch bag containing the gallbladder was then removed from the umbilical port site and passed off as specimen. The RUQ ports were removed under direct visualization and noted to be hemostatic. The umbilical fascia was then closed using the 0 Vicryl purse-string suture. The fascia was palpated and noted to be completely closed. The skin of all incision sites  was approximated with 4-0 monocryl subcuticular suture and dermabond applied. He was then awakened from anesthesia, extubated, and transferred to a stretcher for transport to PACU in satisfactory condition.

## 2020-03-16 NOTE — Anesthesia Procedure Notes (Signed)
Procedure Name: Intubation Date/Time: 03/16/2020 7:37 AM Performed by: Anne Fu, CRNA Pre-anesthesia Checklist: Patient identified, Emergency Drugs available, Suction available, Patient being monitored and Timeout performed Patient Re-evaluated:Patient Re-evaluated prior to induction Oxygen Delivery Method: Circle system utilized Preoxygenation: Pre-oxygenation with 100% oxygen Induction Type: IV induction Ventilation: Mask ventilation without difficulty Laryngoscope Size: Mac and 4 Grade View: Grade I Tube type: Oral Tube size: 7.5 mm Number of attempts: 1 Airway Equipment and Method: Stylet Placement Confirmation: ETT inserted through vocal cords under direct vision,  positive ETCO2 and breath sounds checked- equal and bilateral Secured at: 21 cm Tube secured with: Tape Dental Injury: Teeth and Oropharynx as per pre-operative assessment

## 2020-03-16 NOTE — Transfer of Care (Signed)
Immediate Anesthesia Transfer of Care Note  Patient: Larry Salazar  Procedure(s) Performed: Procedure(s): LAPAROSCOPIC CHOLECYSTECTOMY WITH INTRAOPERATIVE CHOLANGIOGRAM (N/A)  Patient Location: PACU  Anesthesia Type:General  Level of Consciousness:  sedated, patient cooperative and responds to stimulation  Airway & Oxygen Therapy:Patient Spontanous Breathing and Patient connected to face mask oxgen  Post-op Assessment:  Report given to PACU RN and Post -op Vital signs reviewed and stable  Post vital signs:  Reviewed and stable  Last Vitals:  Vitals:   03/16/20 0919 03/16/20 0921  BP: 137/78   Pulse: 69   Resp: 17 (P) 14  Temp: (P) 36.9 C   SpO2: 98%     Complications: No apparent anesthesia complications

## 2020-03-16 NOTE — Progress Notes (Addendum)
PROGRESS NOTE    Yared Barefoot  URK:[id number] DOB: [birth year] DOA: 03/11/2020 PCP: Rich Fuchs, PA    Brief Narrative:  Patient admitted to the hospital with working diagnosis of acute cholecystitis. Hospitalization complicated with post ERCP pancreatitis.   79 year old male who presented with weakness and abdominal pain. He does have significant past medical history for rheumatoid arthritis, hypothyroidism, andchronic kidney disease stage III. Patient reported abdominal discomfort for about 2 weeks, mainly in the epigastric area, with no nausea, vomiting or diarrhea. On the day of admission patient fell onto the floor after feeling dizzy and very weak well getting out of hiscar. On his initial physical examination blood pressure 128/77, heart rate 63, respirate 23, oxygen saturation 97%, temperature 98.4, his lungs are clear to auscultation bilaterally, heart S1-S2, present rhythmic, the abdomen was tender in the right upper quadrant, no rigidity or rebound, lower extremity edema. CT of the abdomen had dilated gallbladder with suspected punctate stones. Gallbladder wall thickening and pericholecystic fluid,soft tissue inflammatory process, consistent with acute cholecystitis. 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  He has been placed on intravenous fluids, IV antibiotics and analgesics. Further work-up with MRCP showed cholelithiasis with suspicion of acute cholecystitis and possible cystic duct stone.  Surgery and gastroenterology have been consulted.   Patient underwent ERCP, finding choledocholithiasis and biliary sludge, complete removal was accomplished with biliary sphincterectomy, sphincteroplasty and balloon sweep.   Post procedure elevated lipase diagnosed with post ERCP acute pancreatitis and cholecystectomy was postponed.   Patient underwent laparoscopic cholecystectomy this am.   Assessment & Plan:   Principal Problem:   Acute  cholecystitis Active Problems:   Rheumatoid arthritis, seronegative, multiple sites [facility])   History of hypothyroidism   RUQ pain   Abdominal pain, epigastric   Choledocholithiasis   1. Acute cholecystitis complicated with cystic duct obstructing stone/ choledocholithiasis/ post ERCP pancretitis. Sp 6/18 biliary sphincterectomy, sphincteroplasty and balloon sweep, removal of choledocholithiasis and sludge.  Today underwent laparoscopic cholecystectomy. While back at the medical ward he had nausea and abdominal pain. His lipase this am down to 113.   Will continue IV fluids and as needed analgesics plus antiemetics. Out of bed to chair and ambulation as tolerated. Patient potentially can be discharge later today if abdominal pain and nausea improves. Will continue close monitoring GI symptoms.   Discontinue antibiotic therapy.     2. AKI on CKD stage 3a (base cr is 1,2). Hypokalemia/ hypomagnesemia.worsening renal function with serum cr at 1,42 with K at 4,2 and serum bicarbonate at 24.   Will add 2 g of mag sulfate today and will continue hydration with balanced electrolyte solutions. Increase rate to 100 ml per H.   3. Hypothyroid.On levothyroxine.   4. RA.Continue to hold onmethotrexate, will plan to resume at discharge.  5. Dyslipidemia. Continue with simvastatin.   Status is: Inpatient  Remains inpatient appropriate because:IV treatments appropriate due to intensity of illness or inability to take PO   Dispo: The patient is from: Home              Anticipated d/c is to: Home              Anticipated d/c date is: 1 day              Patient currently is not medically stable to d/c.   DVT prophylaxis: Enoxaparin   Code Status:   full  Family Communication:  I spoke with patient's wife at the bedside, we talked in  detail about patient's condition, plan of care and prognosis and all questions were addressed.     Consultants:   GI   Surgery   Procedures:    ERCP  Cholecystectomy   Antimicrobials:   Zosyn dc 06/20     Subjective: Patient post procedure with persistent abdominal pain and nausea, no chest pain or dyspnea.   Objective: Vitals:   03/16/20 0945 03/16/20 1000 03/16/20 1015 03/16/20 1033  BP: 121/88 135/70  138/67  Pulse: (!) 58 (!) 58 (!) 52 (!) 55  Resp: 12 (!) 24  17  Temp:   98 F (36.7 C) 97.8 F (36.6 C)  TempSrc:      SpO2: 95% 95% 94% 98%  Weight:      Height:        Intake/Output Summary (Last 24 hours) at 03/16/2020 1056 Last data filed at 03/16/2020 1950 Gross per 24 hour  Intake 4061.23 ml  Output 660 ml  Net 3401.23 ml   Filed Weights   03/11/20 1922 03/14/20 1207  Weight: 78 kg 78.9 kg    Examination:   General: deconditioned and ill looking appearing  Neurology: somnolent and hyporeactive E ENT: milf pallor, no icterus, oral mucosa moist Cardiovascular: No JVD. S1-S2 present, rhythmic, no gallops, rubs, or murmurs. No lower extremity edema. Pulmonary: positive breath sounds bilaterally, adequate air movement, no wheezing, rhonchi or rales. Gastrointestinal. Abdomen mild distended, tender to palpation at the epigastrium. Skin. No rashes Musculoskeletal: no joint deformities     Data Reviewed: I have personally reviewed following labs and imaging studies  CBC: Recent Labs  Lab 03/11/20 2022 03/11/20 2022 03/12/20 0437 03/13/20 0319 03/14/20 0306 03/15/20 0332 03/16/20 0304  WBC 11.5*   < > 8.8 7.0 7.0 8.7 7.3  NEUTROABS 8.2*  --  5.5  --  4.2  --  4.7  HGB 13.8   < > 13.0 12.5* 12.3* 12.0* 11.6*  HCT 42.1   < > 39.3 38.0* 37.9* 36.5* 36.0*  MCV 96.8   < > 97.8 97.4 97.4 96.3 97.6  PLT 179   < > 163 166 190 183 179   < > = values in this interval not displayed.   Basic Metabolic Panel: Recent Labs  Lab 03/12/20 0437 03/13/20 0319 03/14/20 0306 03/15/20 0332 03/16/20 0304  NA 135 136 138 136 137  K 4.1 3.3* 4.0 3.3* 4.2  CL 103 104 110 106 108  CO2 25 23 22 24 24    GLUCOSE 123* 118* 111* 128* 189*  BUN 17 15 8  7* 6*  CREATININE 1.30* 1.34* 1.27* 1.26* 1.42*  CALCIUM 8.6* 8.1* 8.3* 8.2* 8.2*  MG  --  2.1  --   --  1.9   GFR: Estimated Creatinine Clearance: 41.5 mL/min (A) (by C-G formula based on SCr of 1.42 mg/dL (H)). Liver Function Tests: Recent Labs  Lab 03/11/20 2022 03/12/20 0437 03/13/20 0319 03/14/20 0306 03/15/20 0332  AST 21 19 25 25 20   ALT 14 13 13 14 12   ALKPHOS 86 80 89 92 81  BILITOT 2.7* 3.1* 2.4* 1.8* 1.4*  PROT 7.4 6.9 6.4* 6.5 6.2*  ALBUMIN 3.8 3.4* 3.2* 3.0* 2.9*   Recent Labs  Lab 03/11/20 2022 03/15/20 0332 03/16/20 0304  LIPASE 20 1,044* 113*   No results for input(s): AMMONIA in the last 168 hours. Coagulation Profile: Recent Labs  Lab 03/14/20 0306  INR 1.2   Cardiac Enzymes: No results for input(s): CKTOTAL, CKMB, CKMBINDEX, TROPONINI in the last 168 hours. BNP (  last 3 results) No results for input(s): PROBNP in the last 8760 hours. HbA1C: No results for input(s): HGBA1C in the last 72 hours. CBG: No results for input(s): GLUCAP in the last 168 hours. Lipid Profile: No results for input(s): CHOL, HDL, LDLCALC, TRIG, CHOLHDL, LDLDIRECT in the last 72 hours. Thyroid Function Tests: No results for input(s): TSH, T4TOTAL, FREET4, T3FREE, THYROIDAB in the last 72 hours. Anemia Panel: No results for input(s): VITAMINB12, FOLATE, FERRITIN, TIBC, IRON, RETICCTPCT in the last 72 hours.    Radiology Studies: I have reviewed all of the imaging during this hospital visit personally     Scheduled Meds: . HYDROmorphone      . indomethacin  100 mg Rectal Once  . levothyroxine  75 mcg Oral Q0600  . pantoprazole  40 mg Oral Daily  . simvastatin  40 mg Oral q1800   Continuous Infusions: . dextrose 5% lactated ringers 75 mL/hr at 03/16/20 0600  . piperacillin-tazobactam (ZOSYN)  IV 12.5 mL/hr at 03/16/20 0600     LOS: 4 days        Kathee Tumlin Annett Gula, MD

## 2020-03-16 NOTE — Anesthesia Preprocedure Evaluation (Signed)
Anesthesia Evaluation  Patient identified by MRN, date of birth, ID band Patient awake    Reviewed: Allergy & Precautions, NPO status , Patient's Chart, lab work & pertinent test results  Airway Mallampati: I  TM Distance: >3 FB Neck ROM: Full    Dental   Pulmonary former smoker,    Pulmonary exam normal        Cardiovascular Normal cardiovascular exam     Neuro/Psych Anxiety    GI/Hepatic   Endo/Other    Renal/GU      Musculoskeletal   Abdominal   Peds  Hematology   Anesthesia Other Findings   Reproductive/Obstetrics                             Anesthesia Physical Anesthesia Plan  ASA: II  Anesthesia Plan: General   Post-op Pain Management:    Induction: Intravenous  PONV Risk Score and Plan: 2  Airway Management Planned: Oral ETT  Additional Equipment:   Intra-op Plan:   Post-operative Plan: Extubation in OR  Informed Consent: I have reviewed the patients History and Physical, chart, labs and discussed the procedure including the risks, benefits and alternatives for the proposed anesthesia with the patient or authorized representative who has indicated his/her understanding and acceptance.       Plan Discussed with: CRNA and Surgeon  Anesthesia Plan Comments:         Anesthesia Quick Evaluation

## 2020-03-16 NOTE — Progress Notes (Signed)
Dr. Luisa Hart paged concerning pt d/c today. Rn previously spoke with Dr. Ella Jubilee about d/c and wanted Rn to touch base with [facility] md. Pt continues to tolerate ice chips well. Pt remains very weak and tired overall.

## 2020-03-16 NOTE — Progress Notes (Signed)
Pt tolerating ice chips well though still reports nausea and stomach pain. Family at bedside. Rn will continue to monitor and advance diet as pt condition improves.

## 2020-03-17 ENCOUNTER — Other Ambulatory Visit: Payer: Self-pay

## 2020-03-17 ENCOUNTER — Encounter: Payer: Self-pay | Admitting: Gastroenterology

## 2020-03-17 ENCOUNTER — Encounter (HOSPITAL_COMMUNITY): Payer: Self-pay | Admitting: Surgery

## 2020-03-17 DIAGNOSIS — K8050 Calculus of bile duct without cholangitis or cholecystitis without obstruction: Secondary | ICD-10-CM

## 2020-03-17 DIAGNOSIS — K810 Acute cholecystitis: Secondary | ICD-10-CM

## 2020-03-17 LAB — CBC WITH DIFFERENTIAL/PLATELET
Abs Immature Granulocytes: 0.07 10*3/uL (ref 0.00–0.07)
Basophils Absolute: 0 10*3/uL (ref 0.0–0.1)
Basophils Relative: 0 %
Eosinophils Absolute: 0 10*3/uL (ref 0.0–0.5)
Eosinophils Relative: 0 %
HCT: 37 % — ABNORMAL LOW (ref 39.0–52.0)
Hemoglobin: 12 g/dL — ABNORMAL LOW (ref 13.0–17.0)
Immature Granulocytes: 1 %
Lymphocytes Relative: 6 %
Lymphs Abs: 0.8 10*3/uL (ref 0.7–4.0)
MCH: 31.6 pg (ref 26.0–34.0)
MCHC: 32.4 g/dL (ref 30.0–36.0)
MCV: 97.4 fL (ref 80.0–100.0)
Monocytes Absolute: 1.2 10*3/uL — ABNORMAL HIGH (ref 0.1–1.0)
Monocytes Relative: 9 %
Neutro Abs: 10.8 10*3/uL — ABNORMAL HIGH (ref 1.7–7.7)
Neutrophils Relative %: 84 %
Platelets: 207 10*3/uL (ref 150–400)
RBC: 3.8 MIL/uL — ABNORMAL LOW (ref 4.22–5.81)
RDW: 12.2 % (ref 11.5–15.5)
WBC: 12.8 10*3/uL — ABNORMAL HIGH (ref 4.0–10.5)
nRBC: 0 % (ref 0.0–0.2)

## 2020-03-17 LAB — BASIC METABOLIC PANEL
Anion gap: 9 (ref 5–15)
BUN: 9 mg/dL (ref 8–23)
CO2: 25 mmol/L (ref 22–32)
Calcium: 8.7 mg/dL — ABNORMAL LOW (ref 8.9–10.3)
Chloride: 105 mmol/L (ref 98–111)
Creatinine, Ser: 1.39 mg/dL — ABNORMAL HIGH (ref 0.61–1.24)
GFR calc Af Amer: 56 mL/min — ABNORMAL LOW (ref >60)
GFR calc non Af Amer: 48 mL/min — ABNORMAL LOW (ref >60)
Glucose, Bld: 137 mg/dL — ABNORMAL HIGH (ref 70–99)
Potassium: 4.6 mmol/L (ref 3.5–5.1)
Sodium: 139 mmol/L (ref 135–145)

## 2020-03-17 LAB — SURGICAL PATHOLOGY

## 2020-03-17 NOTE — Discharge Summary (Signed)
Physician Discharge Summary  Erie NoeDavid Wilczak ZOX:[id number] DOB: 16-Sep-1941 DOA: 03/11/2020  PCP: Heron NayYoung, Lauren E, PA  Admit date: 03/11/2020 Discharge date: 03/17/2020  Admitted From: Home  Disposition:   Home  Recommendations for Outpatient Follow-up and new medication changes:  1. Follow up surgery as scheduled 2. Follow up with Tobie LordsLauren Young E PA in 7 days.  3. Patient placed on tramadol for pain control to use as needed.  4. Outpatient follow up on infrarenal aortic aneurysm, 3.8 cm   Home Health: no   Equipment/Devices: no    Discharge Condition: stable  CODE STATUS: full  Diet recommendation: soft diet and advance as tolerated.   Brief/Interim Summary: Patient admitted to the hospital with working diagnosis of acute cholecystitis. Hospitalization complicated with post ERCP pancreatitis.   79 year old male who presented with weakness and abdominal pain. He does have significant past medical history for rheumatoid arthritis, hypothyroidism, andchronic kidney disease stage IIIa. Patient reported abdominal discomfort for about 2 weeks, mainly in the epigastric area, with no nausea, vomiting or diarrhea. On the day of admission patient fell onto the floor after feeling dizzy and very weak while getting out of hiscar. On his initial physical examination blood pressure 128/77, heart rate 63, respiratory rate 23, oxygen saturation 97%, temperature 98.4, his lungs were clear to auscultation bilaterally, heart S1-S2, present rhythmic, the abdomen was tender in the right upper quadrant, no rigidity or rebound, no lower extremity edema. Sodium 135, potassium 3.8, chloride 100, bicarb 25, glucose 119, BUN 19, creatinine 1.44, lipase 20, AST 21, ALT 14.  White count 11.5, hemoglobin 13.8, hematocrit 42.1, platelets 179.  SARS COVID-19 negative.  Urinalysis negative for infection. CT of the abdomen had dilated gallbladder with suspected punctate stones. Gallbladder wall thickening and  pericholecystic fluid,soft tissue inflammatory process, consistent with acute cholecystitis. 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 EKG 97 bpm, normal axis, normal intervals, sinus rhythm, no ST segment or T wave changes.  He was placed on intravenous fluids, IV antibiotics and as needed analgesics. Further work-up with MRCP showed cholelithiasis with suspicion of acute cholecystitis and possible cystic duct stone.  Surgery and gastroenterology were consulted.  Patient underwent ERCP, finding choledocholithiasis and biliary sludge, complete removal was accomplished with biliary sphincterectomy, sphincteroplasty and balloon sweep.   Post procedure he had a elevated lipase and he was diagnosed with post ERCP acute pancreatitis.   Patient underwent laparoscopic cholecystectomy 03/16/20  1.  Acute cholecystitis, complicated with cystic duct obstructing stone, choledocholithiasis and post ERCP pancreatitis.  Patient was admitted to the medical ward, he received antibiotic therapy (Zosyn), intravenous fluids and as needed analgesics/antiemetics.  Further work up with MRCP showed cholelithiasis with acute cholecystitis and cystic duct stump.  Patient underwent ERCP June 18, with biliary sphincterectomy, sphincteroplasty and balloon sweep, removal of choledocholithiasis and sludge.   His lipase after ERCP was 1044, and cholecystectomy was postponed for 24 hours.  Follow-up lipase was down to 113 and he underwent successful cholecystectomy.  At the time of discharge he is tolerating p.o. diet adequately, continue tramadol as needed for abdominal pain.  Soft diet and advance as tolerated.  Follow-up as an outpatient schedule.  2.  Acute kidney injury on chronic kidney disease stage IIIa, baseline creatinine 1.2.  Hypokalemia and hypomagnesemia.  Patient received intravenous fluids with balance electrolyte solutions.  His discharge creatinine is 1.39, sodium 139, potassium  4.6 and serum bicarbonate 25.  His peak creatinine reached 1.42 during his hospitalization.  Follow-up kidney function as  an outpatient.  3.  Hypothyroidism.  Continue levothyroxine.  4.  Rheumatoid arthritis.  Methotrexate was held during her hospitalization due to cholecystitis.  At discharge he will resume methotrexate along with prednisone per his home regimen.  5.  Dyslipidemia.  Continue simvastatin.    Discharge Diagnoses:  Principal Problem:   Acute cholecystitis Active Problems:   Rheumatoid arthritis, seronegative, multiple sites ([place])   History of hypothyroidism   RUQ pain   Abdominal pain, epigastric   Choledocholithiasis    Discharge Instructions  Discharge Instructions    Diet - low sodium heart healthy   Complete by: As directed    Discharge instructions   Complete by: As directed    Please follow up with primary care in 7 days.   Increase activity slowly   Complete by: As directed      Allergies as of 03/17/2020   No Known Allergies     Medication List    TAKE these medications   aspirin EC 81 MG tablet Take 81 mg by mouth daily. Swallow whole.   folic acid 1 MG tablet Commonly known as: FOLVITE Take 1 tablet (1 mg total) by mouth daily.   levothyroxine 75 MCG tablet Commonly known as: SYNTHROID Take 75 mcg by mouth daily before breakfast.   methotrexate 2.5 MG tablet Commonly known as: RHEUMATREX Take 3 tablets (7.5 mg total) by mouth once a week. Caution:Chemotherapy. Protect from light.   predniSONE 5 MG tablet Commonly known as: DELTASONE Take 4 tablets by mouth daily x4 days, 3 tablets by mouth daily x4 days, 2 tablets by mouth daily x4 days, 1 tablet by mouth daily x4 days.   simvastatin 40 MG tablet Commonly known as: ZOCOR Take 40 mg by mouth daily.   traMADol 50 MG tablet Commonly known as: Ultram Take 1 tablet (50 mg total) by mouth every 6 (six) hours as needed for up to 5 days (severe pain not controlled with tylenol and  ibuprofen).       Follow-up Information    [facility], [place] On 03/27/2020.   Specialty: General Surgery Why: Your appointment is at 1:30 PM.  Be at the office 30 minutes early for check in.  Bring photo ID and insurance information.   Contact information: [address] [place] [address] [phone]        Rich Fuchs, PA Follow up in 1 week(s).   Specialty: Physician Assistant Contact information: [place] [address] [phone]              No Known Allergies  Consultations:  Surgery   GI    Procedures/Studies: CT ABDOMEN PELVIS W CONTRAST  Result Date: 03/11/2020 CLINICAL DATA:  Right upper quadrant and epigastric pain EXAM: CT ABDOMEN AND PELVIS WITH CONTRAST TECHNIQUE: Multidetector CT imaging of the abdomen and pelvis was performed using the standard protocol following bolus administration of intravenous contrast. CONTRAST:  4mL OMNIPAQUE IOHEXOL 300 MG/ML  SOLN COMPARISON:  Radiograph 03/11/2020 FINDINGS: Lower chest: No acute abnormality. Hepatobiliary: Subcentimeter hypodensity in the right hepatic lobe too small to further characterize. Dilated gallbladder with wall thickening and/or pericholecystic fluid. Mild soft tissue stranding in the right upper quadrant consistent with edema. Probable small calcified stones best seen on coronal views. Mildly enhancing ductal walls on the coronal views. Common bile duct upper normal at 7 mm. Possible common duct stone, series 2, image number 34, coronal series 5, image number 43. Pancreas: Unremarkable. No pancreatic ductal  dilatation or surrounding inflammatory changes. Spleen: Normal in size without focal abnormality. Adrenals/Urinary Tract: Adrenal glands are normal. Cyst in the mid right kidney. No hydronephrosis. Bladder is normal Stomach/Bowel: Stomach is within normal limits. Appendix appears normal. Sigmoid colon diverticular disease without acute inflammatory change. No  evidence of bowel wall thickening, distention, or inflammatory changes. Vascular/Lymphatic: Moderate aortic atherosclerosis. 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 No suspicious nodes Reproductive: Enlarged prostate Other: No free air or free fluid. Musculoskeletal: Degenerative changes of the spine. No acute or suspicious osseous abnormality IMPRESSION: 1. Dilated gallbladder with suspected punctate stones. There is gallbladder wall thickening or pericholecystic fluid with soft tissue inflammatory process in the right upper quadrant, constellation of findings is concerning for an acute cholecystitis. Borderline dilatation of common bile duct with possible small filling defect or stone in the distal duct, correlate with LFTs. Follow-up MRCP as indicated. 2. 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 Recommend followup by ultrasound in 2 years. This recommendation follows ACR consensus guidelines: White Paper of the ACR Incidental Findings Committee II on Vascular Findings. J Am Coll Radiol 2013; 10:789-794. Aortic aneurysm NOS (ICD10-I71.9) 3. Sigmoid colon diverticular disease without acute inflammatory change. Electronically Signed   By: Jasmine Pang M.D.   On: 03/11/2020 22:56   MR 3D Recon At Scanner  Result Date: 03/12/2020 CLINICAL DATA:  Cholelithiasis. Rule out common bile duct stone. Right upper quadrant and epigastric pain. EXAM: MRI ABDOMEN WITHOUT AND WITH CONTRAST (INCLUDING MRCP) TECHNIQUE: Multiplanar multisequence MR imaging of the abdomen was performed both before and after the administration of intravenous contrast. Heavily T2-weighted images of the biliary and pancreatic ducts were obtained, and three-dimensional MRCP images were rendered by post processing. CONTRAST:  27mL GADAVIST GADOBUTROL 1 MMOL/ML IV SOLN COMPARISON:  03/11/2020 CT. FINDINGS: Portions of exam are moderately motion degraded. Lower chest: Mild  cardiomegaly, without pericardial or pleural effusion. Hepatobiliary: No suspicious liver lesion. Tiny gallstone on 22/3. Gallbladder wall thickening is similar, including at 5 mm. Mild pericholecystic edema, including on 18/3. T2 hypointensity of 4 mm on 17/3 is suspected to be in the cystic duct. Possibly correlated on 23/16. Borderline intrahepatic biliary duct dilatation. The common duct measures 7 mm, which is normal for age. Corresponding to the CT abnormality, within the distal common duct, is a T2 hypointense focus on the order of 3 mm on 13/11 and 30/16. Pancreas: No evidence of acute pancreatitis. At least 1 cystic focus within the pancreas including at 4 mm within the tail on 16/3. No main pancreatic duct dilatation. Spleen:  Normal in size, without focal abnormality. Adrenals/Urinary Tract: Normal adrenal glands. Interpolar dominant right renal 5.2 cm cyst. Normal left kidney. Stomach/Bowel: Normal stomach and abdominal bowel loops. Vascular/Lymphatic: Aortic atherosclerosis. Nonaneurysmal dilatation of the infrarenal aorta at 2.6 cm. No retroperitoneal or retrocrural adenopathy. Other:  Trace ascites in the right pericolic gutter. Musculoskeletal: No acute osseous abnormality. IMPRESSION: 1. Moderately motion degraded exam. 2. Cholelithiasis with suspicion of acute cholecystitis and possible cystic duct stone. 3. Isolated choledocholithiasis, as on CT. 4. At least 1 cystic focus within the pancreas. 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 Per consensus criteria, this warrants follow-up pre and post contrast abdominal MRI/MRCP at 2 years. This recommendation follows ACR consensus guidelines: Management of Incidental Pancreatic Cysts: A White Paper of the ACR Incidental Findings Committee. J Am Coll Radiol 2017;14:911-923. 5.  Aortic Atherosclerosis (ICD10-I70.0). Electronically Signed   By: Jeronimo Greaves M.D.   On:  03/12/2020 15:39   DG ERCP BILIARY & PANCREATIC DUCTS  Result Date: 03/14/2020 CLINICAL DATA:   79 year old male with choledocholithiasis EXAM: ERCP TECHNIQUE: Multiple spot images obtained with the fluoroscopic device and submitted for interpretation post-procedure. FLUOROSCOPY TIME:  Fluoroscopy Time:  3 minutes 8 seconds COMPARISON:  None. FINDINGS: A total of 15 intraoperative saved images are submitted for review. The images demonstrate a flexible duodenal scope in the descending duodenum followed by wire cannulation of the common bile duct, sphincterotomy, and balloon sweeping of the common duct. Initial cholangiography demonstrates some evidence of filling defects in the distal common duct consistent with stones. IMPRESSION: ERCP with sphincterotomy and balloon sweeping of the common duct. These images were submitted for radiologic interpretation only. Please see the procedural report for the amount of contrast and the fluoroscopy time utilized. Electronically Signed   By: Malachy Moan M.D.   On: 03/14/2020 15:00   DG Abdomen Acute W/Chest  Result Date: 03/11/2020 CLINICAL DATA:  Epigastric pain, distension, nausea EXAM: DG ABDOMEN ACUTE W/ 1V CHEST COMPARISON:  None. FINDINGS: Supine and upright frontal views of the abdomen as well as an upright frontal view of the chest are obtained. The cardiac silhouette is unremarkable. No airspace disease, effusion, or pneumothorax. Bowel gas pattern is unremarkable. No masses or abnormal calcifications. No free gas in the greater peritoneal sac. IMPRESSION: 1. Unremarkable abdominal series. Electronically Signed   By: Sharlet Salina M.D.   On: 03/11/2020 20:44   MR ABDOMEN MRCP W WO CONTAST  Result Date: 03/12/2020 CLINICAL DATA:  Cholelithiasis. Rule out common bile duct stone. Right upper quadrant and epigastric pain. EXAM: MRI ABDOMEN WITHOUT AND WITH CONTRAST (INCLUDING MRCP) TECHNIQUE: Multiplanar multisequence MR imaging of the abdomen was performed both before and after the administration of intravenous contrast. Heavily T2-weighted images of  the biliary and pancreatic ducts were obtained, and three-dimensional MRCP images were rendered by post processing. CONTRAST:  25mL GADAVIST GADOBUTROL 1 MMOL/ML IV SOLN COMPARISON:  03/11/2020 CT. FINDINGS: Portions of exam are moderately motion degraded. Lower chest: Mild cardiomegaly, without pericardial or pleural effusion. Hepatobiliary: No suspicious liver lesion. Tiny gallstone on 22/3. Gallbladder wall thickening is similar, including at 5 mm. Mild pericholecystic edema, including on 18/3. T2 hypointensity of 4 mm on 17/3 is suspected to be in the cystic duct. Possibly correlated on 23/16. Borderline intrahepatic biliary duct dilatation. The common duct measures 7 mm, which is normal for age. Corresponding to the CT abnormality, within the distal common duct, is a T2 hypointense focus on the order of 3 mm on 13/11 and 30/16. Pancreas: No evidence of acute pancreatitis. At least 1 cystic focus within the pancreas including at 4 mm within the tail on 16/3. No main pancreatic duct dilatation. Spleen:  Normal in size, without focal abnormality. Adrenals/Urinary Tract: Normal adrenal glands. Interpolar dominant right renal 5.2 cm cyst. Normal left kidney. Stomach/Bowel: Normal stomach and abdominal bowel loops. Vascular/Lymphatic: Aortic atherosclerosis. Nonaneurysmal dilatation of the infrarenal aorta at 2.6 cm. No retroperitoneal or retrocrural adenopathy. Other:  Trace ascites in the right pericolic gutter. Musculoskeletal: No acute osseous abnormality. IMPRESSION: 1. Moderately motion degraded exam. 2. Cholelithiasis with suspicion of acute cholecystitis and possible cystic duct stone. 3. Isolated choledocholithiasis, as on CT. 4. At least 1 cystic focus within the pancreas. 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 Per consensus criteria, this warrants follow-up pre and post contrast abdominal MRI/MRCP at 2 years. This recommendation follows ACR consensus guidelines: Management of Incidental Pancreatic Cysts: A White Paper  of the  ACR Incidental Findings Committee. J Am Coll Radiol 2017;14:911-923. 5.  Aortic Atherosclerosis (ICD10-I70.0). Electronically Signed   By: Jeronimo Greaves M.D.   On: 03/12/2020 15:39      Procedures: ERCP and laparoscopic cholecystectomy   Subjective: Patient is feeling better, his abdominal pain has improved, no nausea or vomiting, no chest pain or dyspnea.   Discharge Exam: Vitals:   03/16/20 2020 03/17/20 0443  BP: 129/73 109/70  Pulse: 60 62  Resp: 15 14  Temp: 98.5 F (36.9 C) 98 F (36.7 C)  SpO2: 94% 96%   Vitals:   03/16/20 1232 03/16/20 1322 03/16/20 2020 03/17/20 0443  BP: (!) 150/75 (!) 144/73 129/73 109/70  Pulse: (!) 56 (!) 53 60 62  Resp: Temp: 97.6 F (36.4 C) (!) 97.5 F (36.4 C) 98.5 F (36.9 C) 98 F (36.7 C)  TempSrc:      SpO2: 90% 95% 94% 96%  Weight:      Height:        General: Not in pain or dyspnea.  Neurology: Awake and alert, non focal  E ENT: no pallor, no icterus, oral mucosa moist Cardiovascular: No JVD. S1-S2 present, rhythmic, no gallops, rubs, or murmurs. No lower extremity edema. Pulmonary: positive breath sounds bilaterally, adequate air movement, no wheezing, rhonchi or rales. Gastrointestinal. Abdomen with, no organomegaly, non tender to superficial plapation, no rebound or guarding Skin. No rashes Musculoskeletal: no joint deformities   The results of significant diagnostics from this hospitalization (including imaging, microbiology, ancillary and laboratory) are listed below for reference.     Microbiology: Recent Results (from the past 240 hour(s))  SARS Coronavirus 2 by RT PCR (hospital order, performed in [facility] hospital lab) Nasopharyngeal Nasopharyngeal Swab     Status: None   Collection Time: 03/11/20 11:20 PM   Specimen: Nasopharyngeal Swab  Result Value Ref Range Status   SARS Coronavirus 2 NEGATIVE NEGATIVE Final    Comment: (NOTE) SARS-CoV-2 target nucleic acids are NOT DETECTED.  The  SARS-CoV-2 RNA is generally detectable in upper and lower respiratory specimens during the acute phase of infection. The lowest concentration of SARS-CoV-2 viral copies this assay can detect is 250 copies / mL. A negative result does not preclude SARS-CoV-2 infection and should not be used as the sole basis for treatment or other patient management decisions.  A negative result may occur with improper specimen collection / handling, submission of specimen other than nasopharyngeal swab, presence of viral mutation(s) within the areas targeted by this assay, and inadequate number of viral copies (<250 copies / mL). A negative result must be combined with clinical observations, patient history, and epidemiological information.  Fact Sheet for Patients:   BoilerBrush.com.cy  Fact Sheet for Healthcare Providers: https://pope.com/  This test is not yet approved or  cleared by the [place] FDA and has been authorized for detection and/or diagnosis of SARS-CoV-2 by FDA under an Emergency Use Authorization (EUA).  This EUA will remain in effect (meaning this test can be used) for the duration of the COVID-19 declaration under Section 564(b)(1) of the Act, 21 U.S.C. section 360bbb-3(b)(1), unless the authorization is terminated or revoked sooner.  Performed at [facility], [address] [place], [place] [address]   Surgical pcr screen     Status: None   Collection Time: 03/15/20  5:05 AM   Specimen: Nasal Mucosa; Nasal Swab  Result Value Ref Range Status   MRSA, PCR NEGATIVE NEGATIVE Final  Staphylococcus aureus NEGATIVE NEGATIVE Final    Comment: (NOTE) The Xpert SA Assay (FDA approved for NASAL specimens in patients 66 years of age and older), is one component of a comprehensive surveillance program. It is not intended to diagnose infection nor to guide or monitor treatment. Performed at [facility],  [address], [place], [place] [address]      Labs: BNP (last 3 results) No results for input(s): BNP in the last 8760 hours. Basic Metabolic Panel: Recent Labs  Lab 03/13/20 0319 03/14/20 0306 03/15/20 0332 03/16/20 0304 03/17/20 0315  NA 136 138 136 137 139  K 3.3* 4.0 3.3* 4.2 4.6  CL 104 110 106 108 105  CO2 23 22 24 24 25   GLUCOSE 118* 111* 128* 189* 137*  BUN 15 8 7* 6* 9  CREATININE 1.34* 1.27* 1.26* 1.42* 1.39*  CALCIUM 8.1* 8.3* 8.2* 8.2* 8.7*  MG 2.1  --   --  1.9  --    Liver Function Tests: Recent Labs  Lab 03/11/20 2022 03/12/20 0437 03/13/20 0319 03/14/20 0306 03/15/20 0332  AST 21 19 25 25 20   ALT 14 13 13 14 12   ALKPHOS 86 80 89 92 81  BILITOT 2.7* 3.1* 2.4* 1.8* 1.4*  PROT 7.4 6.9 6.4* 6.5 6.2*  ALBUMIN 3.8 3.4* 3.2* 3.0* 2.9*   Recent Labs  Lab 03/11/20 2022 03/15/20 0332 03/16/20 0304  LIPASE 20 1,044* 113*   No results for input(s): AMMONIA in the last 168 hours. CBC: Recent Labs  Lab 03/11/20 2022 03/11/20 2022 03/12/20 0437 03/12/20 0437 03/13/20 0319 03/14/20 0306 03/15/20 0332 03/16/20 0304 03/17/20 0315  WBC 11.5*   < > 8.8   < > 7.0 7.0 8.7 7.3 12.8*  NEUTROABS 8.2*  --  5.5  --   --  4.2  --  4.7 10.8*  HGB 13.8   < > 13.0   < > 12.5* 12.3* 12.0* 11.6* 12.0*  HCT 42.1   < > 39.3   < > 38.0* 37.9* 36.5* 36.0* 37.0*  MCV 96.8   < > 97.8   < > 97.4 97.4 96.3 97.6 97.4  PLT 179   < > 163   < > 166 190 183 179 207   < > = values in this interval not displayed.   Cardiac Enzymes: No results for input(s): CKTOTAL, CKMB, CKMBINDEX, TROPONINI in the last 168 hours. BNP: Invalid input(s): POCBNP CBG: No results for input(s): GLUCAP in the last 168 hours. D-Dimer No results for input(s): DDIMER in the last 72 hours. Hgb A1c No results for input(s): HGBA1C in the last 72 hours. Lipid Profile No results for input(s): CHOL, HDL, LDLCALC, TRIG, CHOLHDL, LDLDIRECT in the last 72 hours. Thyroid function studies No results  for input(s): TSH, T4TOTAL, T3FREE, THYROIDAB in the last 72 hours.  Invalid input(s): FREET3 Anemia work up No results for input(s): VITAMINB12, FOLATE, FERRITIN, TIBC, IRON, RETICCTPCT in the last 72 hours. Urinalysis    Component Value Date/Time   COLORURINE YELLOW 03/11/2020 2048   APPEARANCEUR CLEAR 03/11/2020 2048   LABSPEC 1.025 03/11/2020 2048   PHURINE 5.5 03/11/2020 2048   GLUCOSEU NEGATIVE 03/11/2020 2048   HGBUR TRACE (A) 03/11/2020 2048   BILIRUBINUR NEGATIVE 03/11/2020 2048   KETONESUR NEGATIVE 03/11/2020 2048   PROTEINUR NEGATIVE 03/11/2020 2048   NITRITE NEGATIVE 03/11/2020 2048   LEUKOCYTESUR NEGATIVE 03/11/2020 2048   Sepsis Labs Invalid input(s): PROCALCITONIN,  WBC,  LACTICIDVEN Microbiology Recent Results (from the past 240 hour(s))  SARS Coronavirus 2 by RT PCR (hospital order, performed in [facility] hospital lab) Nasopharyngeal Nasopharyngeal Swab     Status: None   Collection Time: 03/11/20 11:20 PM   Specimen: Nasopharyngeal Swab  Result Value Ref Range Status   SARS Coronavirus 2 NEGATIVE NEGATIVE Final    Comment: (NOTE) SARS-CoV-2 target nucleic acids are NOT DETECTED.  The SARS-CoV-2 RNA is generally detectable in upper and lower respiratory specimens during the acute phase of infection. The lowest concentration of SARS-CoV-2 viral copies this assay can detect is 250 copies / mL. A negative result does not preclude SARS-CoV-2 infection and should not be used as the sole basis for treatment or other patient management decisions.  A negative result may occur with improper specimen collection / handling, submission of specimen other than nasopharyngeal swab, presence of viral mutation(s) within the areas targeted by this assay, and inadequate number of viral copies (<250 copies / mL). A negative result must be combined with clinical observations, patient history, and epidemiological information.  Fact Sheet for Patients:    BoilerBrush.com.cy  Fact Sheet for Healthcare Providers: https://pope.com/  This test is not yet approved or  cleared by the [place] FDA and has been authorized for detection and/or diagnosis of SARS-CoV-2 by FDA under an Emergency Use Authorization (EUA).  This EUA will remain in effect (meaning this test can be used) for the duration of the COVID-19 declaration under Section 564(b)(1) of the Act, 21 U.S.C. section 360bbb-3(b)(1), unless the authorization is terminated or revoked sooner.  Performed at [facility], [address] [place], [place] [address]   Surgical pcr screen     Status: None   Collection Time: 03/15/20  5:05 AM   Specimen: Nasal Mucosa; Nasal Swab  Result Value Ref Range Status   MRSA, PCR NEGATIVE NEGATIVE Final   Staphylococcus aureus NEGATIVE NEGATIVE Final    Comment: (NOTE) The Xpert SA Assay (FDA approved for NASAL specimens in patients 38 years of age and older), is one component of a comprehensive surveillance program. It is not intended to diagnose infection nor to guide or monitor treatment. Performed at [facility], [address], [place], [place] [address]      Time coordinating discharge: 45 minutes  SIGNED:   Coralie Keens, MD  [facility] 03/17/2020, 8:55 AM

## 2020-03-17 NOTE — Progress Notes (Signed)
[facility] Progress Note  1 Day Post-Op  Subjective: Patient reports mild soreness but doing well overall. Tolerating diet without nausea. Discussed post-op care with patient and family members at bedside.   Objective: Vital signs in last 24 hours: Temp:  [97.5 F (36.4 C)-98.5 F (36.9 C)] 98 F (36.7 C) (06/21 0443) Pulse Rate:  [50-62] 62 (06/21 0443) Resp:  [14-24] 14 (06/21 0443) BP: (109-150)/(67-77) 109/70 (06/21 0443) SpO2:  [90 %-98 %] 96 % (06/21 0443) Last BM Date: 03/16/20  Intake/Output from previous day: 06/20 0701 - 06/21 0700 In: 3424.9 [P.O.:1020; I.V.:2341; IV Piggyback:63.9] Out: 2010 [Urine:2000; Blood:10] Intake/Output this shift: No intake/output data recorded.  PE: General: pleasant, WD, WN white male who is laying in bed in NAD Heart: regular, rate, and rhythm. Palpable radial and pedal pulses bilaterally Lungs: Respiratory effort nonlabored Abd: soft, NT, ND, incisions c/d/i  Lab Results:  Recent Labs    03/16/20 0304 03/17/20 0315  WBC 7.3 12.8*  HGB 11.6* 12.0*  HCT 36.0* 37.0*  PLT 179 207   BMET Recent Labs    03/16/20 0304 03/17/20 0315  NA 137 139  K 4.2 4.6  CL 108 105  CO2 24 25  GLUCOSE 189* 137*  BUN 6* 9  CREATININE 1.42* 1.39*  CALCIUM 8.2* 8.7*   PT/INR No results for input(s): LABPROT, INR in the last 72 hours. CMP     Component Value Date/Time   NA 139 03/17/2020 0315   K 4.6 03/17/2020 0315   CL 105 03/17/2020 0315   CO2 25 03/17/2020 0315   GLUCOSE 137 (H) 03/17/2020 0315   BUN 9 03/17/2020 0315   CREATININE 1.39 (H) 03/17/2020 0315   CREATININE 1.07 01/15/2020 1024   CALCIUM 8.7 (L) 03/17/2020 0315   PROT 6.2 (L) 03/15/2020 0332   ALBUMIN 2.9 (L) 03/15/2020 0332   AST 20 03/15/2020 0332   ALT 12 03/15/2020 0332   ALKPHOS 81 03/15/2020 0332   BILITOT 1.4 (H) 03/15/2020 0332   GFRNONAA 48 (L) 03/17/2020 0315   GFRNONAA 67 01/15/2020 1024   GFRAA 56 (L) 03/17/2020 0315   GFRAA 77  01/15/2020 1024   Lipase     Component Value Date/Time   LIPASE 113 (H) 03/16/2020 0304       Studies/Results: No results found.  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

## 2020-03-17 NOTE — Plan of Care (Signed)

## 2020-03-17 NOTE — Progress Notes (Signed)
Patient ambulated the entire length of the hallway and back, steady gait, states he feels terrific, tolerating clears with no more N/V, new iv placed in right forearm due to infiltration, d5lr infusing without issue, vss, patient looking forward to going home tomorrow, will continue to monitor.

## 2020-03-18 LAB — SURGICAL PATHOLOGY

## 2020-03-24 ENCOUNTER — Ambulatory Visit: Payer: Medicare Other | Admitting: Gastroenterology

## 2020-04-15 ENCOUNTER — Other Ambulatory Visit: Payer: Self-pay | Admitting: Rheumatology

## 2020-04-15 DIAGNOSIS — M0609 Rheumatoid arthritis without rheumatoid factor, multiple sites: Secondary | ICD-10-CM

## 2020-04-15 NOTE — Telephone Encounter (Signed)
Last Visit: 01/15/2020 Next Visit: 05/28/2020 Labs: 03/17/2020 WBC 12.8, RBC 3.80, Hgb 12.0, Hct 37.0, Neutro Abs 10.8, Monocytes Absolute 1.2, Glucose 137, Creat. 1.39, GFR 48, Calcium 8.7  Current Dose per office note on 01/15/2020: methotrexate 3 tablets p.o. weekly  Dx: Rheumatoid arthritis, seronegative, multiple sites   Okay to refill MTX?

## 2020-04-16 ENCOUNTER — Telehealth: Payer: Self-pay | Admitting: Rheumatology

## 2020-04-16 NOTE — Telephone Encounter (Signed)
Attempted to contact the patient and left message for patient to call the office.  

## 2020-04-16 NOTE — Telephone Encounter (Signed)
Opened in error

## 2020-04-16 NOTE — Telephone Encounter (Signed)
He had a cholecystectomy performed on 03/16/20.  A future order for hepatic panel is in place.  Please clarify when the patient is supposed to have LFTs rechecked.  He will also require BMP with GFR to recheck renal function.   Ok to refill low dose MTX.  He will require close monitoring of his renal function and LFTs.

## 2020-05-14 NOTE — Progress Notes (Signed)
Office Visit Note  Patient: Larry Salazar             Date of Birth: [birth year]           MRN: [id number]             PCP: Heron Nay, PA Referring: Heron Nay, PA Visit Date: 05/28/2020 Occupation: @GUAROCC @  Subjective:  Medication monitoring   History of Present Illness: Larry Salazar is a 79 y.o. male with history of seronegative rheumatoid arthritis and osteoarthritis.  He is currently taking methotrexate 3 tablets by mouth once weekly and folic acid 1 mg by mouth daily.  He has not had any recent rheumatoid arthritis flares.  He denies any joint pain, joint swelling, morning stiffness, or difficulty with ADLs.  He remains active without any increased discomfort. He will be moving to [place] to be closer to family and will eventually be transferring care but has not established with a new rheumatologist yet.  Activities of Daily Living:  Patient reports morning stiffness for  0 minutes.   Patient Denies nocturnal pain.  Difficulty dressing/grooming: Denies Difficulty climbing stairs: Denies Difficulty getting out of chair: Denies Difficulty using hands for taps, buttons, cutlery, and/or writing: Denies  Review of Systems  Constitutional: Negative for fatigue.  HENT: Negative for mouth sores, mouth dryness and nose dryness.   Eyes: Negative for itching and dryness.  Respiratory: Negative for shortness of breath and difficulty breathing.   Cardiovascular: Negative for chest pain and palpitations.  Gastrointestinal: Negative for blood in stool, constipation and diarrhea.  Endocrine: Negative for increased urination.  Genitourinary: Negative for difficulty urinating.  Musculoskeletal: Negative for arthralgias, joint pain, joint swelling, myalgias, morning stiffness, muscle tenderness and myalgias.  Skin: Negative for color change, rash and redness.  Allergic/Immunologic: Negative for susceptible to infections.  Neurological: Positive for dizziness.  Negative for numbness, headaches, memory loss and weakness.  Hematological: Positive for bruising/bleeding tendency.  Psychiatric/Behavioral: Negative for confusion.    PMFS History:  Patient Active Problem List   Diagnosis Date Noted  . Choledocholithiasis   . RUQ pain   . Abdominal pain, epigastric   . Acute cholecystitis 03/11/2020  . Rheumatoid arthritis, seronegative, multiple sites (HCC) 05/05/2017  . High risk medications  long-term use 05/05/2017  . Needle phobia 05/05/2017  . History of renal insufficiency 05/05/2017  . Primary osteoarthritis of both hands 05/05/2017  . Primary osteoarthritis of both feet 05/05/2017  . History of biceps tendon repair  05/05/2017  . History of hypothyroidism 05/05/2017  . History of hyperlipidemia 05/05/2017  . Former smoker Quit 2017  05/05/2017    Past Medical History:  Diagnosis Date  . High cholesterol   . Hypothyroidism   . Rheumatoid arthritis (HCC)     Family History  Problem Relation Age of Onset  . Heart attack Father   . Hypertension Father   . Diabetes Brother   . Diabetes Sister   . Diabetes Sister   . Heart attack Son   . Healthy Son   . Healthy Daughter    Past Surgical History:  Procedure Laterality Date  . BILIARY DILATION  03/14/2020   Procedure: BILIARY DILATION;  Surgeon: 03/16/2020 Meridee Score., MD;  [facility];  Service: Gastroenterology;;  . BIOPSY  03/14/2020   Procedure: BIOPSY;  Surgeon: 03/16/2020., MD;  [facility];  Service: Gastroenterology;;  . [facility] N/A 03/16/2020   Procedure: LAPAROSCOPIC CHOLECYSTECTOMY WITH INTRAOPERATIVE CHOLANGIOGRAM;  Surgeon: 03/18/2020,  MD;  Location: [facility];  Service: General;  Laterality: N/A;  . ERCP N/A 03/14/2020   Procedure: ENDOSCOPIC RETROGRADE CHOLANGIOPANCREATOGRAPHY (ERCP);  Surgeon: [facility]ar Lofty., MD;  Location: [facility];  Service: Gastroenterology;  Laterality: N/A;  .  ESOPHAGOGASTRODUODENOSCOPY (EGD) WITH PROPOFOL N/A 03/14/2020   Procedure: ESOPHAGOGASTRODUODENOSCOPY (EGD) WITH PROPOFOL;  Surgeon: Meridee Score [facility]., MD;  Location: [facility];  Service: Gastroenterology;  Laterality: N/A;  . EUS  03/14/2020   Procedure: UPPER ENDOSCOPIC ULTRASOUND (EUS) LINEAR;  Surgeon: [facility]ar Lofty., MD;  Location: [facility];  Service: Gastroenterology;;  . SHOULDER SURGERY Right   . SPHINCTEROTOMY  03/14/2020   Procedure: SPHINCTEROTOMY;  Surgeon: Mansouraty, [facility]ty Starring., MD;  Location: [facility];  Service: Gastroenterology;;  stone sludge removal   Social History   Social History Narrative  . Not on file   Immunization History  Administered Date(s) Administered  . PFIZER SARS-COV-2 Vaccination 12/05/2019, 12/26/2019     Objective: Vital Signs: BP 137/85 (BP Location: Left Arm, Patient Position: Sitting, Cuff Size: Normal)   Pulse (!) 56   Resp 15   Ht 5\' 5"  (1.651 m)   Wt 170 lb 3.2 oz (77.2 kg)   BMI 28.32 kg/m    Physical Exam Vitals and nursing note reviewed.  Constitutional:      Appearance: He is well-developed.  HENT:     Head: Normocephalic and atraumatic.  Eyes:     Conjunctiva/sclera: Conjunctivae normal.     Pupils: Pupils are equal, round, and reactive to light.  Pulmonary:     Effort: Pulmonary effort is normal.  Abdominal:     Palpations: Abdomen is soft.  Musculoskeletal:     Cervical back: Normal range of motion and neck supple.  Skin:    General: Skin is warm and dry.     Capillary Refill: Capillary refill takes less than 2 seconds.  Neurological:     Mental Status: He is alert and oriented to person, place, and time.  Psychiatric:        Behavior: Behavior normal.      Musculoskeletal Exam: C-spine, thoracic spine, lumbar spine have good range of motion.  Shoulder joints, elbow joints, wrist joints, MCPs, PIPs, DIPs have good range of motion with no synovitis.  He has complete fist formation bilaterally.   PIP and DIP thickening consistent with osteoarthritis of both hands.  Hip joints have good range of motion with no discomfort.  Knee joints have good range of motion with no warmth or effusion.  Ankle joints have good range of motion with no tenderness or inflammation.  CDAI Exam: CDAI Score: -- Patient Global: --; Provider Global: -- Swollen: --; Tender: -- Joint Exam 05/28/2020   No joint exam has been documented for this visit   There is currently no information documented on the homunculus. Go to the Rheumatology activity and complete the homunculus joint exam.  Investigation: No additional findings.  Imaging: No results found.  Recent Labs: Lab Results  Component Value Date   WBC 12.8 (H) 03/17/2020   HGB 12.0 (L) 03/17/2020   PLT 207 03/17/2020   NA 139 03/17/2020   K 4.6 03/17/2020   CL 105 03/17/2020   CO2 25 03/17/2020   GLUCOSE 137 (H) 03/17/2020   BUN 9 03/17/2020   CREATININE 1.39 (H) 03/17/2020   BILITOT 1.4 (H) 03/15/2020   ALKPHOS 81 03/15/2020   AST 20 03/15/2020   ALT 12 03/15/2020   PROT 6.2 (L) 03/15/2020   ALBUMIN 2.9 (L) 03/15/2020  CALCIUM 8.7 (L) 03/17/2020   GFRAA 56 (L) 03/17/2020   QFTBGOLDPLUS INDETERMINATE (A) 01/21/2020    Speciality Comments: No specialty comments available.  Procedures:  No procedures performed Allergies: Patient has no known allergies.   Assessment / Plan:     Visit Diagnoses: Rheumatoid arthritis, seronegative, multiple sites [facility]): He has no synovitis or joint tenderness on exam.  He has not had any recent rheumatoid arthritis flares.  He is clinically doing well on methotrexate 3 tablets by mouth once weekly and folic acid 1 mg by mouth daily.  He is not experiencing any joint pain, inflammation, morning stiffness, or difficulty with ADLs.  He remains active without any discomfort.  He will continue taking methotrexate 3 tablets by mouth once weekly and folic acid 1 mg by mouth daily.  He does not need any refills  at this time.  He will be transferring care once he has moved to [place] permanently.  He will follow-up in our office as needed.  High risk medications (not anticoagulants) long-term use - Methotrexate 3 tablets p.o. weekly along with folic acid 1 mg p.o. daily.  CBC and BMP were drawn on 03/17/2020.  He is due to update lab work today.  Orders for CBC and CMP were released.  He will be due for updated lab work in December and every 3 months to monitor for drug toxicity.- Plan: CBC with Differential/Platelet, COMPLETE METABOLIC PANEL WITH GFR He has received both COVID-19 vaccinations. He has not had any recent infections.  He was advised to hold methotrexate if he develops signs or symptoms of infection and to resume once the infection has completely cleared.  Primary osteoarthritis of both hands: He has PIP and DIP thickening consistent with osteoarthritis of both hands.  No tenderness or inflammation noted.  He is able to make a complete fist bilaterally.  He is not having any discomfort or morning stiffness.  Primary osteoarthritis of both feet: He is not having any discomfort in his feet at this time.  He wears proper fitting shoes.  History of biceps tendon repair: He has good range of motion of both shoulder joints with no discomfort.  Prepatellar bursitis of right knee: Resolved.  Other medical conditions are listed as follows:  Needle phobia  History of hypothyroidism  History of renal insufficiency  History of hyperlipidemia  Former smoker Quit 2017   Orders: Orders Placed This Encounter  Procedures  . CBC with Differential/Platelet  . COMPLETE METABOLIC PANEL WITH GFR   No orders of the defined types were placed in this encounter.     Follow-Up Instructions: Return for Rheumatoid arthritis, Osteoarthritis.   Gearldine Bienenstock, PA-C  Note - This record has been created using Dragon software.  Chart creation errors have been sought, but may not always  have been  located. Such creation errors do not reflect on  the standard of medical care.

## 2020-05-28 ENCOUNTER — Ambulatory Visit: Payer: Medicare Other | Admitting: Gastroenterology

## 2020-05-28 ENCOUNTER — Other Ambulatory Visit: Payer: Self-pay

## 2020-05-28 ENCOUNTER — Ambulatory Visit (INDEPENDENT_AMBULATORY_CARE_PROVIDER_SITE_OTHER): Payer: Medicare Other | Admitting: Physician Assistant

## 2020-05-28 ENCOUNTER — Encounter: Payer: Self-pay | Admitting: Physician Assistant

## 2020-05-28 VITALS — BP 137/85 | HR 56 | Resp 15 | Ht 65.0 in | Wt 170.2 lb

## 2020-05-28 DIAGNOSIS — Z87448 Personal history of other diseases of urinary system: Secondary | ICD-10-CM

## 2020-05-28 DIAGNOSIS — M19042 Primary osteoarthritis, left hand: Secondary | ICD-10-CM

## 2020-05-28 DIAGNOSIS — M19072 Primary osteoarthritis, left ankle and foot: Secondary | ICD-10-CM

## 2020-05-28 DIAGNOSIS — M0609 Rheumatoid arthritis without rheumatoid factor, multiple sites: Secondary | ICD-10-CM

## 2020-05-28 DIAGNOSIS — M19041 Primary osteoarthritis, right hand: Secondary | ICD-10-CM | POA: Diagnosis not present

## 2020-05-28 DIAGNOSIS — M19071 Primary osteoarthritis, right ankle and foot: Secondary | ICD-10-CM | POA: Diagnosis not present

## 2020-05-28 DIAGNOSIS — Z87891 Personal history of nicotine dependence: Secondary | ICD-10-CM

## 2020-05-28 DIAGNOSIS — Z79899 Other long term (current) drug therapy: Secondary | ICD-10-CM | POA: Diagnosis not present

## 2020-05-28 DIAGNOSIS — Z8639 Personal history of other endocrine, nutritional and metabolic disease: Secondary | ICD-10-CM

## 2020-05-28 DIAGNOSIS — Z9889 Other specified postprocedural states: Secondary | ICD-10-CM

## 2020-05-28 DIAGNOSIS — M7041 Prepatellar bursitis, right knee: Secondary | ICD-10-CM

## 2020-05-28 DIAGNOSIS — F40298 Other specified phobia: Secondary | ICD-10-CM

## 2020-05-28 NOTE — Patient Instructions (Signed)
COVID-19 vaccine recommendations:  ° °COVID-19 vaccine is recommended for everyone (unless you are allergic to a vaccine component), even if you are on a medication that suppresses your immune system.  ° °If you are on Methotrexate, Cellcept (mycophenolate), Rinvoq, Xeljanz, and Olumiant- hold the medication for 1 week after each vaccine. Hold Methotrexate for 2 weeks after the single dose COVID-19 vaccine.  ° °If you are on Orencia subcutaneous injection - hold medication one week prior to and one week after the first COVID-19 vaccine dose (only).  ° °If you are on Orencia IV infusions- time vaccination administration so that the first COVID-19 vaccination will occur four weeks after the infusion and postpone the subsequent infusion by one week.  ° °If you are on Cyclophosphamide or Rituxan infusions please contact your doctor prior to receiving the COVID-19 vaccine.  ° °Do not take Tylenol or any anti-inflammatory medications (NSAIDs) 24 hours prior to the COVID-19 vaccination.  ° °There is no direct evidence about the efficacy of the COVID-19 vaccine in individuals who are on medications that suppress the immune system.  ° °Even if you are fully vaccinated, and you are on any medications that suppress your immune system, please continue to wear a mask, maintain at least six feet social distance and practice hand hygiene.  ° °If you develop a COVID-19 infection, please contact your PCP or our office to determine if you need antibody infusion. ° °The booster vaccine is now available for immunocompromised patients. It is advised that if you had Pfizer vaccine you should get Pfizer booster.  If you had a Moderna vaccine then you should get a Moderna booster. Johnson and Johnson does not have a booster vaccine at this time. ° °Please see the following web sites for updated information.   ° °https://www.rheumatology.org/Portals/0/Files/COVID-19-Vaccination-Patient-Resources.pdf ° °https://www.rheumatology.org/About-Us/Newsroom/Press-Releases/ID/1159 ° °Standing Labs °We placed an order today for your standing lab work.  ° °Please have your standing labs drawn in December and every 3 months  ° °If possible, please have your labs drawn 2 weeks prior to your appointment so that the provider can discuss your results at your appointment. ° °We have open lab daily °Monday through Thursday from 8:30-12:30 PM and 1:30-4:30 PM and Friday from 8:30-12:30 PM and 1:30-4:00 PM °at the office of Dr. Shaili Deveshwar, [facility].   °Please be advised, patients with office appointments requiring lab work will take precedents over walk-in lab work.  °If possible, please come for your lab work on Monday and Friday afternoons, as you may experience shorter wait times. °The office is located at [address], [address], [place], [place] [address] °No appointment is necessary.   °Labs are drawn by [facility]. Please bring your co-pay at the time of your lab draw.  You may receive a bill from [facility] for your lab work. ° °If you wish to have your labs drawn at another location, please call the office 24 hours in advance to send orders. ° °If you have any questions regarding directions or hours of operation,  °please call [phone].   °As a reminder, please drink plenty of water prior to coming for your lab work. Thanks! ° ° °

## 2020-05-29 LAB — COMPLETE METABOLIC PANEL WITH GFR
AG Ratio: 1.8 (calc) (ref 1.0–2.5)
ALT: 13 U/L (ref 9–46)
AST: 25 U/L (ref 10–35)
Albumin: 4.6 g/dL (ref 3.6–5.1)
Alkaline phosphatase (APISO): 104 U/L (ref 35–144)
BUN/Creatinine Ratio: 14 (calc) (ref 6–22)
BUN: 21 mg/dL (ref 7–25)
CO2: 28 mmol/L (ref 20–32)
Calcium: 9.6 mg/dL (ref 8.6–10.3)
Chloride: 104 mmol/L (ref 98–110)
Creat: 1.53 mg/dL — ABNORMAL HIGH (ref 0.70–1.18)
GFR, Est African American: 50 mL/min/{1.73_m2} — ABNORMAL LOW (ref >=60)
GFR, Est Non African American: 43 mL/min/{1.73_m2} — ABNORMAL LOW (ref >=60)
Globulin: 2.6 g/dL (calc) (ref 1.9–3.7)
Glucose, Bld: 111 mg/dL — ABNORMAL HIGH (ref 65–99)
Potassium: 4.6 mmol/L (ref 3.5–5.3)
Sodium: 140 mmol/L (ref 135–146)
Total Bilirubin: 2.6 mg/dL — ABNORMAL HIGH (ref 0.2–1.2)
Total Protein: 7.2 g/dL (ref 6.1–8.1)

## 2020-05-29 LAB — CBC WITH DIFFERENTIAL/PLATELET
Absolute Monocytes: 517 cells/uL (ref 200–950)
Basophils Absolute: 77 cells/uL (ref 0–200)
Basophils Relative: 1.4 %
Eosinophils Absolute: 149 cells/uL (ref 15–500)
Eosinophils Relative: 2.7 %
HCT: 43.4 % (ref 38.5–50.0)
Hemoglobin: 14.3 g/dL (ref 13.2–17.1)
Lymphs Abs: 1227 cells/uL (ref 850–3900)
MCH: 32.9 pg (ref 27.0–33.0)
MCHC: 32.9 g/dL (ref 32.0–36.0)
MCV: 99.8 fL (ref 80.0–100.0)
MPV: 10.8 fL (ref 7.5–12.5)
Monocytes Relative: 9.4 %
Neutro Abs: 3531 cells/uL (ref 1500–7800)
Neutrophils Relative %: 64.2 %
Platelets: 190 10*3/uL (ref 140–400)
RBC: 4.35 10*6/uL (ref 4.20–5.80)
RDW: 13.2 % (ref 11.0–15.0)
Total Lymphocyte: 22.3 %
WBC: 5.5 10*3/uL (ref 3.8–10.8)

## 2020-05-29 NOTE — Progress Notes (Signed)
Creatinine is elevated and trending up.  GFR is low.  Please advise the patient to avoid taking NSAIDs. Dr. Corliss Skains would like the patient to reduce MTX to 2 tablets once weekly.  Recheck CMP in 1 month.   CBC WNL.

## 2020-05-30 ENCOUNTER — Other Ambulatory Visit: Payer: Self-pay

## 2020-05-30 ENCOUNTER — Ambulatory Visit (INDEPENDENT_AMBULATORY_CARE_PROVIDER_SITE_OTHER): Payer: Medicare Other | Admitting: Gastroenterology

## 2020-05-30 ENCOUNTER — Encounter: Payer: Self-pay | Admitting: Gastroenterology

## 2020-05-30 VITALS — BP 140/74 | HR 61 | Ht 65.0 in | Wt 173.0 lb

## 2020-05-30 DIAGNOSIS — R17 Unspecified jaundice: Secondary | ICD-10-CM

## 2020-05-30 DIAGNOSIS — K8050 Calculus of bile duct without cholangitis or cholecystitis without obstruction: Secondary | ICD-10-CM

## 2020-05-30 DIAGNOSIS — K862 Cyst of pancreas: Secondary | ICD-10-CM

## 2020-05-30 NOTE — Patient Instructions (Addendum)
If you are age 78 or older, your body mass index should be between 23-30. Your Body mass index is 28.79 kg/m. If this is out of the aforementioned range listed, please consider follow up with your Primary Care Provider.  If you are age 54 or younger, your body mass index should be between 19-25. Your Body mass index is 28.79 kg/m. If this is out of the aformentioned range listed, please consider follow up with your Primary Care Provider.   Your provider has requested that you go to the basement level for lab work at [address] [place], [place] . Press "B" on the elevator. The lab is located at the first door on the left as you exit the elevator. LFT's  We will call you with results.  It was a pleasure to see you today!  Vito Cirigliano, D.O.

## 2020-05-30 NOTE — Progress Notes (Signed)
P  Chief Complaint:    Hospital follow-up  GI History: 79 year old male with a history of HTN, hypothyroidism, diverticulosis, RA (on MTX), CKD 3.  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  Underwent EUS and ERCP on 6/18 with biliary sphincterotomy, sphincteroplasty, and balloon sweep with removal of CBD stones and sludge.  Course c/b post ERCP pancreatitis, improved with IV fluids and bowel rest, underwent successful cholecystectomy.  MRI/MRCP on admission also notable for small pancreatic cyst, with recommendation to repeat MRI in 2 years.  HPI:     Patient is a 79 y.o. male presenting [facility] for hospital follow-up.  He feels great and is without any complaints today.  He does report that they are planning on selling her house and moving to [place], [place]  in the near future.   -05/28/2020: T bili 2.6 (similar to previous with suspicion for superimposed Gilbert's), otherwise normal liver enzymes, normal ALP.  Normal CBC.  Will check fractionated bili with next labs for confirmation    Review of systems:     No chest pain, no SOB, no fevers, no urinary sx   Past Medical History:  Diagnosis Date  . High cholesterol   . Hypothyroidism   . Rheumatoid arthritis (HCC)     Patient's surgical history, family medical history, social history, medications and allergies were all reviewed in Epic    Current Outpatient Medications  Medication Sig Dispense Refill  . aspirin EC 81 MG tablet Take 81 mg by mouth daily. Swallow whole.    . folic acid (FOLVITE) 1 MG tablet Take 1 tablet (1 mg total) by mouth daily. 90 tablet 1  . levothyroxine (SYNTHROID, LEVOTHROID) 75 MCG tablet Take 75 mcg by mouth daily before breakfast.     . methotrexate (RHEUMATREX) 2.5 MG tablet TAKE 3 TABLETS BY MOUTH  ONCE WEEKLY . CAUTION:  CHEMOTHERAPY. PROTECT FROM  LIGHT 36 tablet 0  . simvastatin (ZOCOR) 40 MG tablet Take 40 mg by mouth daily.      No current  facility-administered medications for this visit.    Physical Exam:     BP 140/74   Pulse 61   Ht 5\' 5"  (1.651 m)   Wt 173 lb (78.5 kg)   BMI 28.79 kg/m   GENERAL:  Pleasant male in NAD PSYCH: : Cooperative, normal affect EENT:  conjunctiva pink, mucous membranes moist, neck supple without masses ABDOMEN: Well-healed surgical incision scars.  Nondistended, soft, nontender. No obvious masses, no hepatomegaly,  normal bowel sounds SKIN:  turgor, no lesions seen Musculoskeletal:  Normal muscle tone, normal strength NEURO: Alert and oriented x 3, no focal neurologic deficits   IMPRESSION and PLAN:    1) History of choledocholithiasis 2) History of cholecystitis - s/p ERCP with stone extraction followed by cholecystectomy.  Doing well postoperatively  3) Elevated T bili -T bili has been elevated since at least 2018, as high as 2.7.  During recent hospitalization, T bili was 3.1 and direct was 0.4, consistent with Gilbert's - Recheck LFTs for confirmation of diagnosis  4) Pancreatic cyst -MRCP as an inpatient notable for small 4 mm pancreatic tail cyst, suspected to be a pseudocyst - MRI in 2 years per guidelines  Patient will be moving to [place].  Can follow-up in this clinic as needed, and once he has established with PCM and GI in [place], we can make notes available for review.  I spent 25 minutes of time, including independent review of  results as outlined above, communicating results with the patient directly, face-to-face time with the patient, coordinating care, ordering studies and medications as appropriate, and documentation.            Shellia Cleverly ,DO, FACG 05/30/2020, 2:17 PM

## 2020-06-06 ENCOUNTER — Other Ambulatory Visit: Payer: Self-pay | Admitting: Physician Assistant

## 2020-06-06 DIAGNOSIS — M0609 Rheumatoid arthritis without rheumatoid factor, multiple sites: Secondary | ICD-10-CM

## 2020-06-09 NOTE — Telephone Encounter (Signed)
Last Visit: 05/28/2020 Nest Visit: Patient is moving. Labs: 05/28/2020 Creatinine is elevated and trending up. GFR is low. CBC WNL  Current Dose per lab note on 05/28/2020: reduce MTX to 2 tablets once weekly  Okay to refill MTX?

## 2020-08-31 ENCOUNTER — Other Ambulatory Visit: Payer: Self-pay | Admitting: Rheumatology

## 2020-09-01 NOTE — Telephone Encounter (Signed)
Last Visit: 05/28/2020 Nest Visit: Patient is moving.  Okay to refill per Dr. Corliss Skains

## 2020-11-25 ENCOUNTER — Other Ambulatory Visit: Payer: Self-pay | Admitting: Rheumatology

## 2020-11-25 DIAGNOSIS — M0609 Rheumatoid arthritis without rheumatoid factor, multiple sites: Secondary | ICD-10-CM

## 2020-11-25 NOTE — Telephone Encounter (Signed)
Patient has moved to [place] 

## 2020-11-25 NOTE — Telephone Encounter (Deleted)
Patient moved

## 2021-07-07 IMAGING — DX DG ABDOMEN ACUTE W/ 1V CHEST
3 series · 3 of 3 positions shown · non-contrast
Comparison: None.

CLINICAL DATA: Epigastric pain, distension, nausea

EXAM:
DG ABDOMEN ACUTE W/ 1V CHEST

[chest pa]
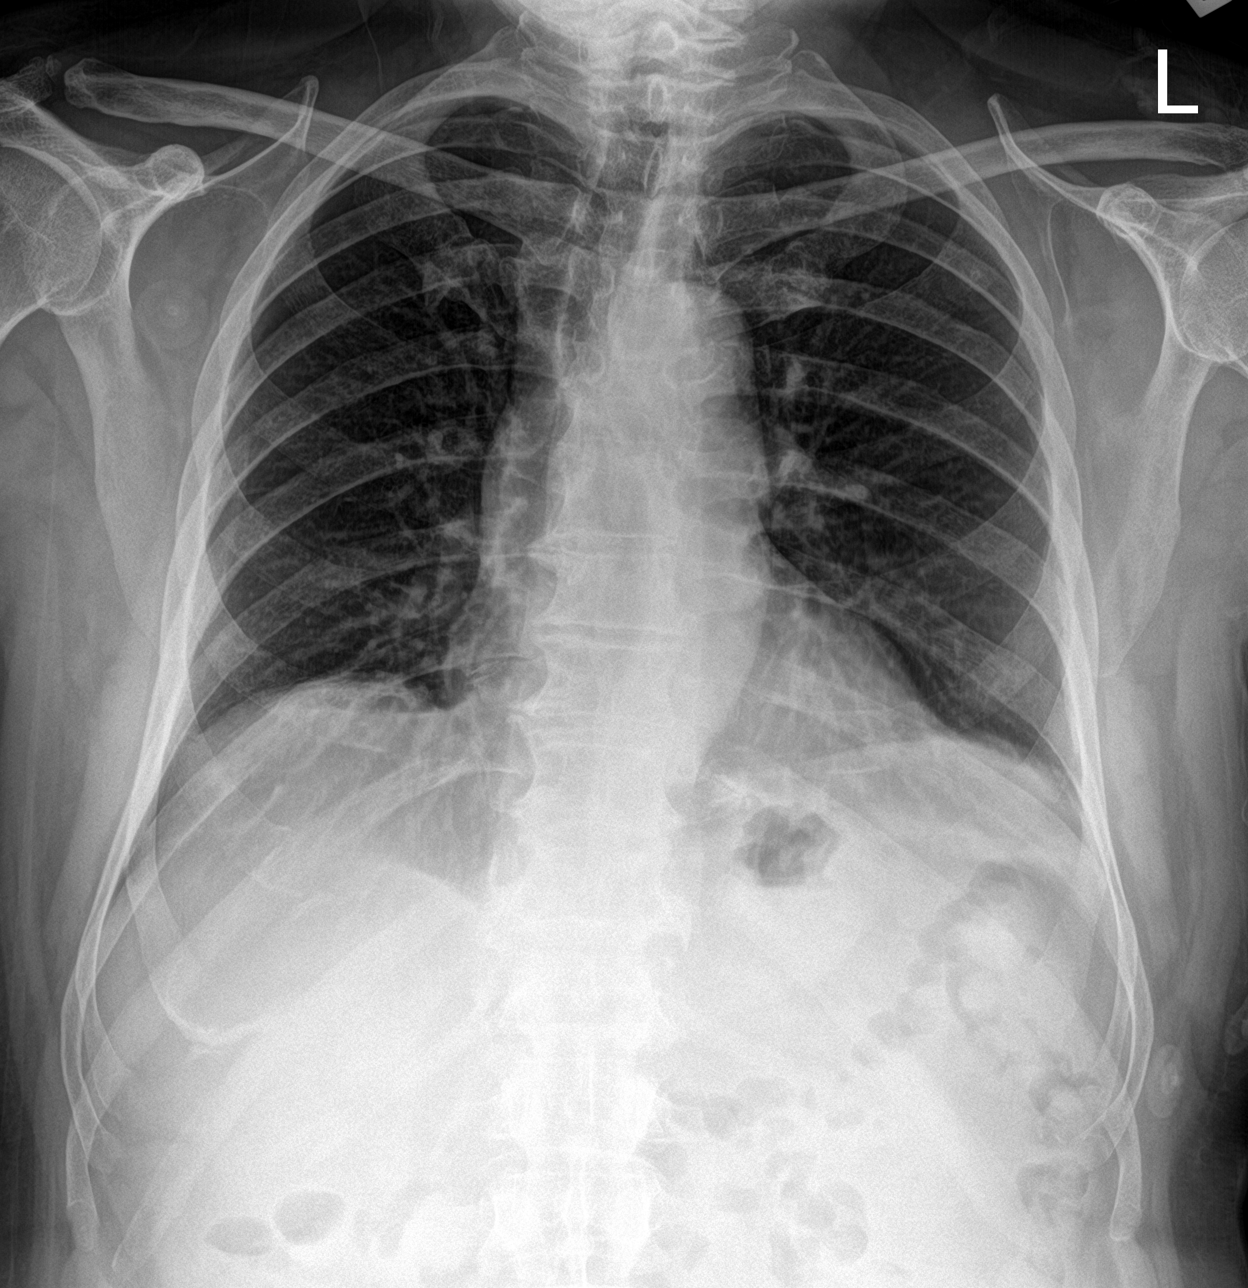

[abdomen erect]
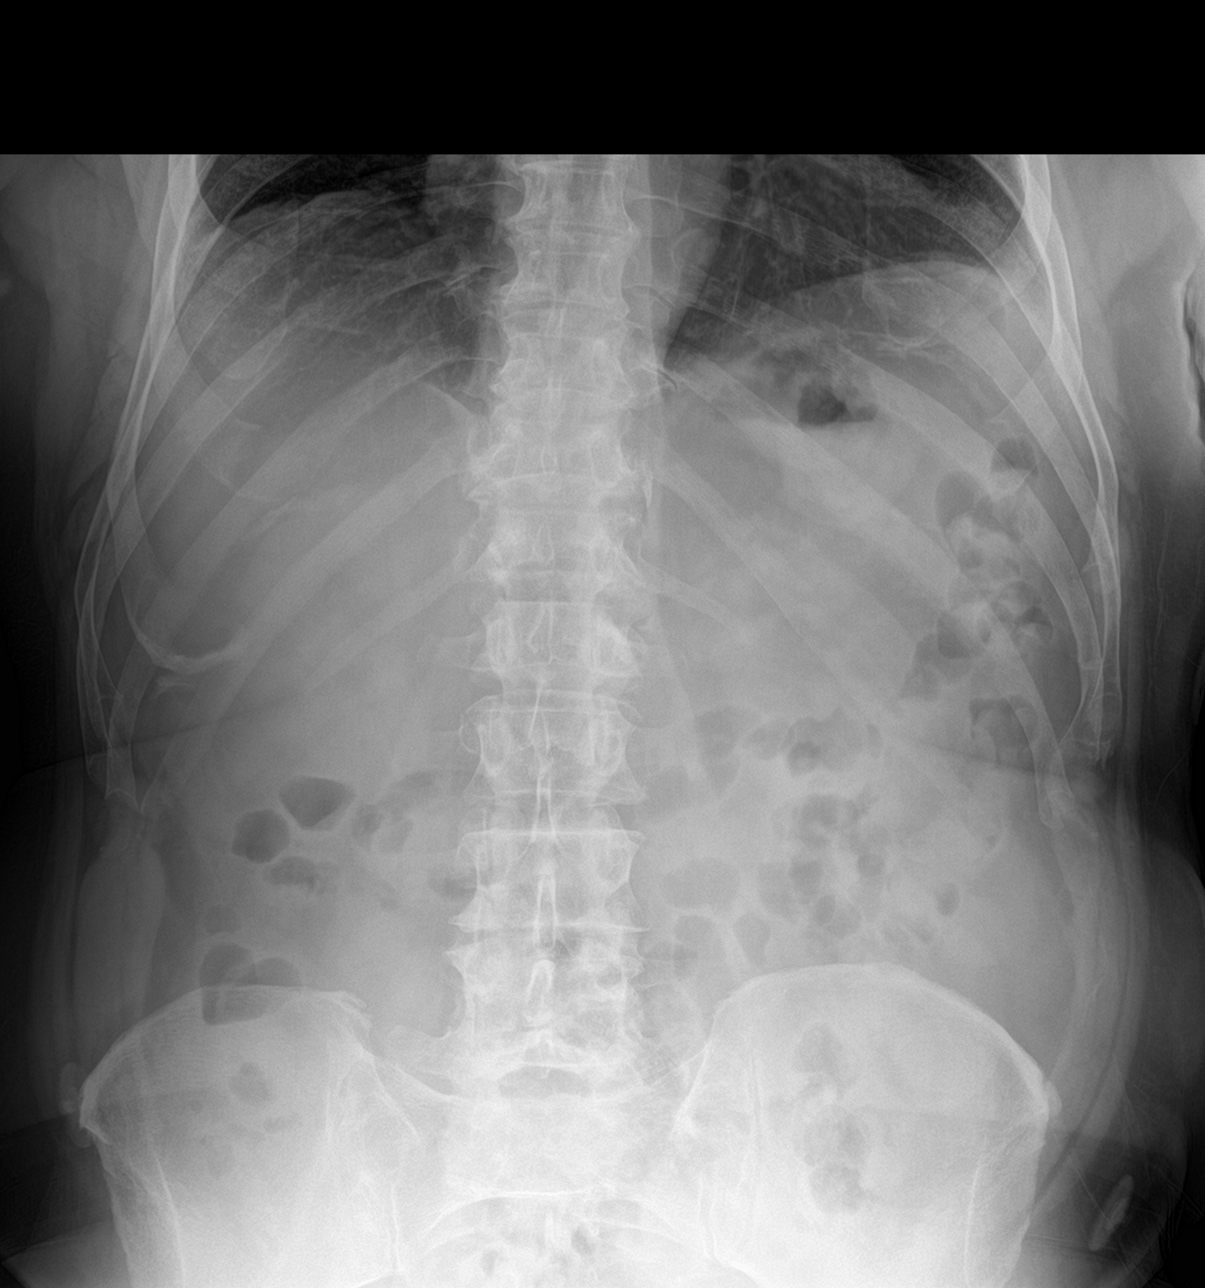

[abdomen supine]
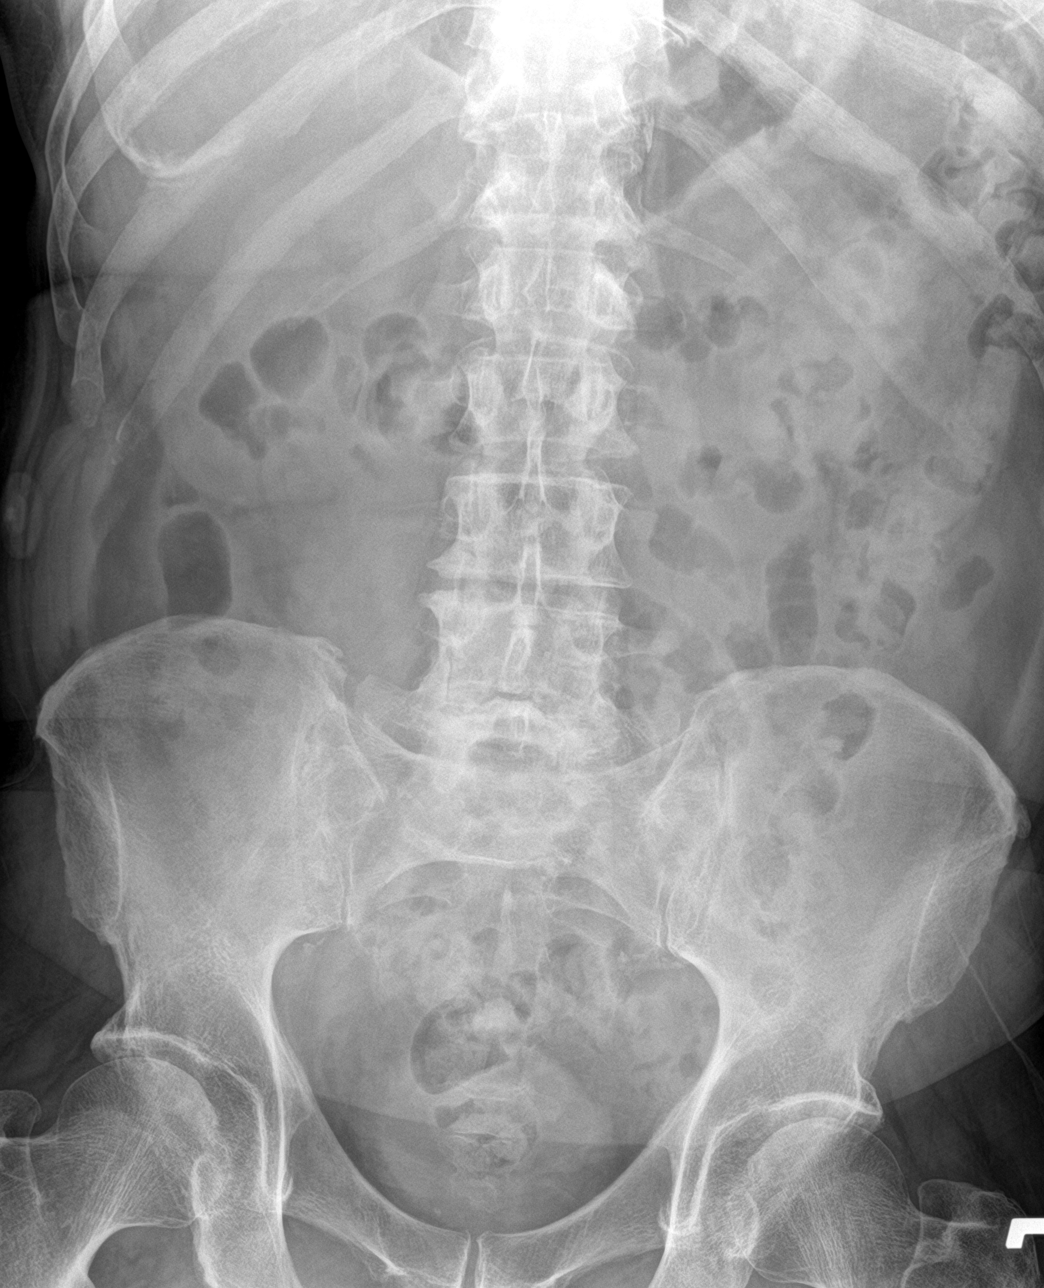

[3 of 3 positions shown; findings below may reference images not displayed]

FINDINGS: Supine and upright frontal views of the abdomen as well as an
upright frontal view of the chest are obtained. The cardiac
silhouette is unremarkable. No airspace disease, effusion, or
pneumothorax. Bowel gas pattern is unremarkable. No masses or
abnormal calcifications. No free gas in the greater peritoneal sac.
IMPRESSION: 1. Unremarkable abdominal series.
